# Patient Record
Sex: Male | Born: 2000 | Hispanic: No | Marital: Single | Smoking: Current every day smoker
Health system: Southern US, Community
[De-identification: ages and names within clinical notes are randomized; demographics above are authoritative.]

## PROBLEM LIST (undated history)

## (undated) DIAGNOSIS — F909 Attention-deficit hyperactivity disorder, unspecified type: Secondary | ICD-10-CM

## (undated) DIAGNOSIS — F32A Depression, unspecified: Secondary | ICD-10-CM

## (undated) DIAGNOSIS — F319 Bipolar disorder, unspecified: Secondary | ICD-10-CM

## (undated) DIAGNOSIS — E785 Hyperlipidemia, unspecified: Secondary | ICD-10-CM

## (undated) DIAGNOSIS — I10 Essential (primary) hypertension: Secondary | ICD-10-CM

## (undated) HISTORY — PX: TYMPANOSTOMY TUBE PLACEMENT: SHX32

## (undated) HISTORY — DX: Essential (primary) hypertension: I10

## (undated) HISTORY — DX: Bipolar disorder, unspecified: F31.9

## (undated) HISTORY — DX: Hyperlipidemia, unspecified: E78.5

## (undated) HISTORY — PX: WRIST SURGERY: SHX841

## (undated) HISTORY — DX: Depression, unspecified: F32.A

## (undated) HISTORY — DX: Attention-deficit hyperactivity disorder, unspecified type: F90.9

---
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
## 2018-04-15 ENCOUNTER — Emergency Department
Admission: EM | Admit: 2018-04-15 | Discharge: 2018-04-16 | Disposition: A | Discharge status: Home / Self Care | Payer: Medicaid Other | Attending: Emergency Medicine | Admitting: Emergency Medicine

## 2018-04-15 ENCOUNTER — Other Ambulatory Visit: Payer: Self-pay

## 2018-04-15 DIAGNOSIS — R05 Cough: Secondary | ICD-10-CM | POA: Insufficient documentation

## 2018-04-15 DIAGNOSIS — J029 Acute pharyngitis, unspecified: Secondary | ICD-10-CM

## 2018-04-15 DIAGNOSIS — B349 Viral infection, unspecified: Secondary | ICD-10-CM | POA: Diagnosis not present

## 2018-04-15 DIAGNOSIS — Z87891 Personal history of nicotine dependence: Secondary | ICD-10-CM | POA: Insufficient documentation

## 2018-04-15 DIAGNOSIS — R509 Fever, unspecified: Secondary | ICD-10-CM | POA: Diagnosis present

## 2018-04-15 DIAGNOSIS — R059 Cough, unspecified: Secondary | ICD-10-CM

## 2018-04-15 NOTE — ED Notes (Signed)
Pt called in triage X3 no response. Will try again.

## 2018-04-15 NOTE — ED Triage Notes (Addendum)
Patient reports running a fever today.  Patient also reports sore throat and cough.

## 2018-04-16 ENCOUNTER — Emergency Department: Payer: Medicaid Other

## 2018-04-16 LAB — GROUP A STREP BY PCR: Group A Strep by PCR: NOT DETECTED

## 2018-04-16 MED ORDER — MAGIC MOUTHWASH: 5.0000 mL | Freq: Three times a day (TID) | ORAL | 0 refills | Status: DC | PRN

## 2018-04-16 MED ORDER — MAGIC MOUTHWASH
10.0000 mL | Freq: Once | ORAL | Status: AC
  Administered 2018-04-16: 10 mL via ORAL
  Filled 2018-04-16: qty 10

## 2018-04-16 NOTE — ED Provider Notes (Signed)
[facility] Emergency Department Provider Note   ____________________________________________   First MD Initiated Contact with Patient 04/16/18 0050     (approximate)  I have reviewed the triage vital signs and the nursing notes.   HISTORY  Chief Complaint Fever    HPI Ivan [name] is a 17 y.o. male who presents to the ED from home with a chief complaint of fever, sore throat and cough.  Patient reports a 1 day history of the above symptoms.  States maximum temperature was "100 something".  Denies ear pain, chest pain, shortness of breath, abdominal pain, nausea, vomiting or diarrhea.  Denies recent travel or trauma.   Past medical history None  There are no active problems to display for this patient.   No past surgical history on file.  Prior to Admission medications   Medication Sig Start Date End Date Taking? Authorizing Provider  magic mouthwash SOLN Take 5 mLs by mouth 3 (three) times daily as needed for mouth pain. 04/16/18   Irean HongSung, Armella Stogner J, MD    Allergies Patient has no known allergies.  No family history on file.  Social History Social History   Tobacco Use  . Smoking status: Current Every Day Smoker  Substance Use Topics  . Alcohol use: No    Frequency: Never  . Drug use: Not on file    Review of Systems  Constitutional: Positive for fever. Eyes: No visual changes. ENT: Positive for sore throat. Cardiovascular: Denies chest pain. Respiratory: Positive for nonproductive cough.  Denies shortness of breath. Gastrointestinal: No abdominal pain.  No nausea, no vomiting.  No diarrhea.  No constipation. Genitourinary: Negative for dysuria. Musculoskeletal: Negative for back pain. Skin: Negative for rash. Neurological: Negative for headaches, focal weakness or numbness.   ____________________________________________   PHYSICAL EXAM:  VITAL SIGNS: ED Triage Vitals  Enc Vitals Group     BP 04/15/18 2227 (!) 143/85   Pulse Rate 04/15/18 2227 97     Resp 04/15/18 2227 18     Temp 04/15/18 2227 98.4 F (36.9 C)     Temp Source 04/15/18 2227 Oral     SpO2 04/15/18 2227 95 %     Weight 04/15/18 2224 220 lb (99.8 kg)     Height 04/15/18 2224 6\' 3"  (1.905 m)     Head Circumference --      Peak Flow --      Pain Score --      Pain Loc --      Pain Edu? --      Excl. in GC? --     Constitutional: Asleep, awakened for exam.  Alert and oriented. Well appearing and in no acute distress. Eyes: Conjunctivae are normal. PERRL. EOMI. Head: Atraumatic. Nose: No congestion/rhinnorhea. Mouth/Throat: Mucous membranes are moist.  Oropharynx minimally erythematous without tonsillar swelling, exudates or peritonsillar abscess.  There is no hoarse or muffled voice.  There is no drooling. Neck: No stridor.  Supple neck without meningismus. Hematological/Lymphatic/Immunilogical: No cervical lymphadenopathy. Cardiovascular: Normal rate, regular rhythm. Grossly normal heart sounds.  Good peripheral circulation. Respiratory: Normal respiratory effort.  No retractions. Lungs CTAB. Gastrointestinal: Soft and nontender. No distention. No abdominal bruits. No CVA tenderness. Musculoskeletal: No lower extremity tenderness nor edema.  No joint effusions. Neurologic:  Normal speech and language. No gross focal neurologic deficits are appreciated. No gait instability. Skin:  Skin is warm, dry and intact. No rash noted.  No petechiae. Psychiatric: Mood and affect are normal. Speech and behavior are normal.  ____________________________________________   LABS (all labs ordered are listed, but only abnormal results are displayed)  Labs Reviewed  GROUP A STREP BY PCR   ____________________________________________  EKG  None ____________________________________________  RADIOLOGY  ED MD interpretation: No acute cardiopulmonary process  Official radiology report(s): Dg Chest 2 View  Result Date: 04/16/2018 CLINICAL  DATA:  Fever and cough EXAM: CHEST - 2 VIEW COMPARISON:  None. FINDINGS: The heart size and mediastinal contours are within normal limits. Both lungs are clear. The visualized skeletal structures are unremarkable. IMPRESSION: No active cardiopulmonary disease. Electronically Signed   By: Jasmine Pang M.D.   On: 04/16/2018 00:19    ____________________________________________   PROCEDURES  Procedure(s) performed: None  Procedures  Critical Care performed: No  ____________________________________________   INITIAL IMPRESSION / ASSESSMENT AND PLAN / ED COURSE  As part of my medical decision making, I reviewed the following data within the electronic MEDICAL RECORD NUMBER Nursing notes reviewed and incorporated, Labs reviewed, Radiograph reviewed and Notes from prior ED visits   17 year old male who presents with URI symptoms.  Rapid strep is negative; chest x-ray negative for pneumonia.  Will treat with Magic mouthwash and patient will follow-up with his PCP as needed.  Strict return precautions given.  Patient verbalizes understanding and agrees with plan of care.      ____________________________________________   FINAL CLINICAL IMPRESSION(S) / ED DIAGNOSES  Final diagnoses:  Viral illness  Sore throat  Cough     ED Discharge Orders        Ordered    magic mouthwash SOLN  3 times daily PRN     04/16/18 0116       Note:  This document was prepared using Dragon voice recognition software and may include unintentional dictation errors.    Irean Hong, MD 04/16/18 [phone]

## 2018-04-16 NOTE — Discharge Instructions (Addendum)
1.  You may take Magic mouthwash as needed for throat discomfort. 2.  Return to the ER for worsening symptoms, persistent vomiting, difficulty breathing or other concerns.

## 2018-09-28 IMAGING — CR DG CHEST 2V
2 series · 2 of 2 positions shown · non-contrast
Comparison: None.

CLINICAL DATA: Fever and cough

EXAM:
CHEST - 2 VIEW

[chest pa]
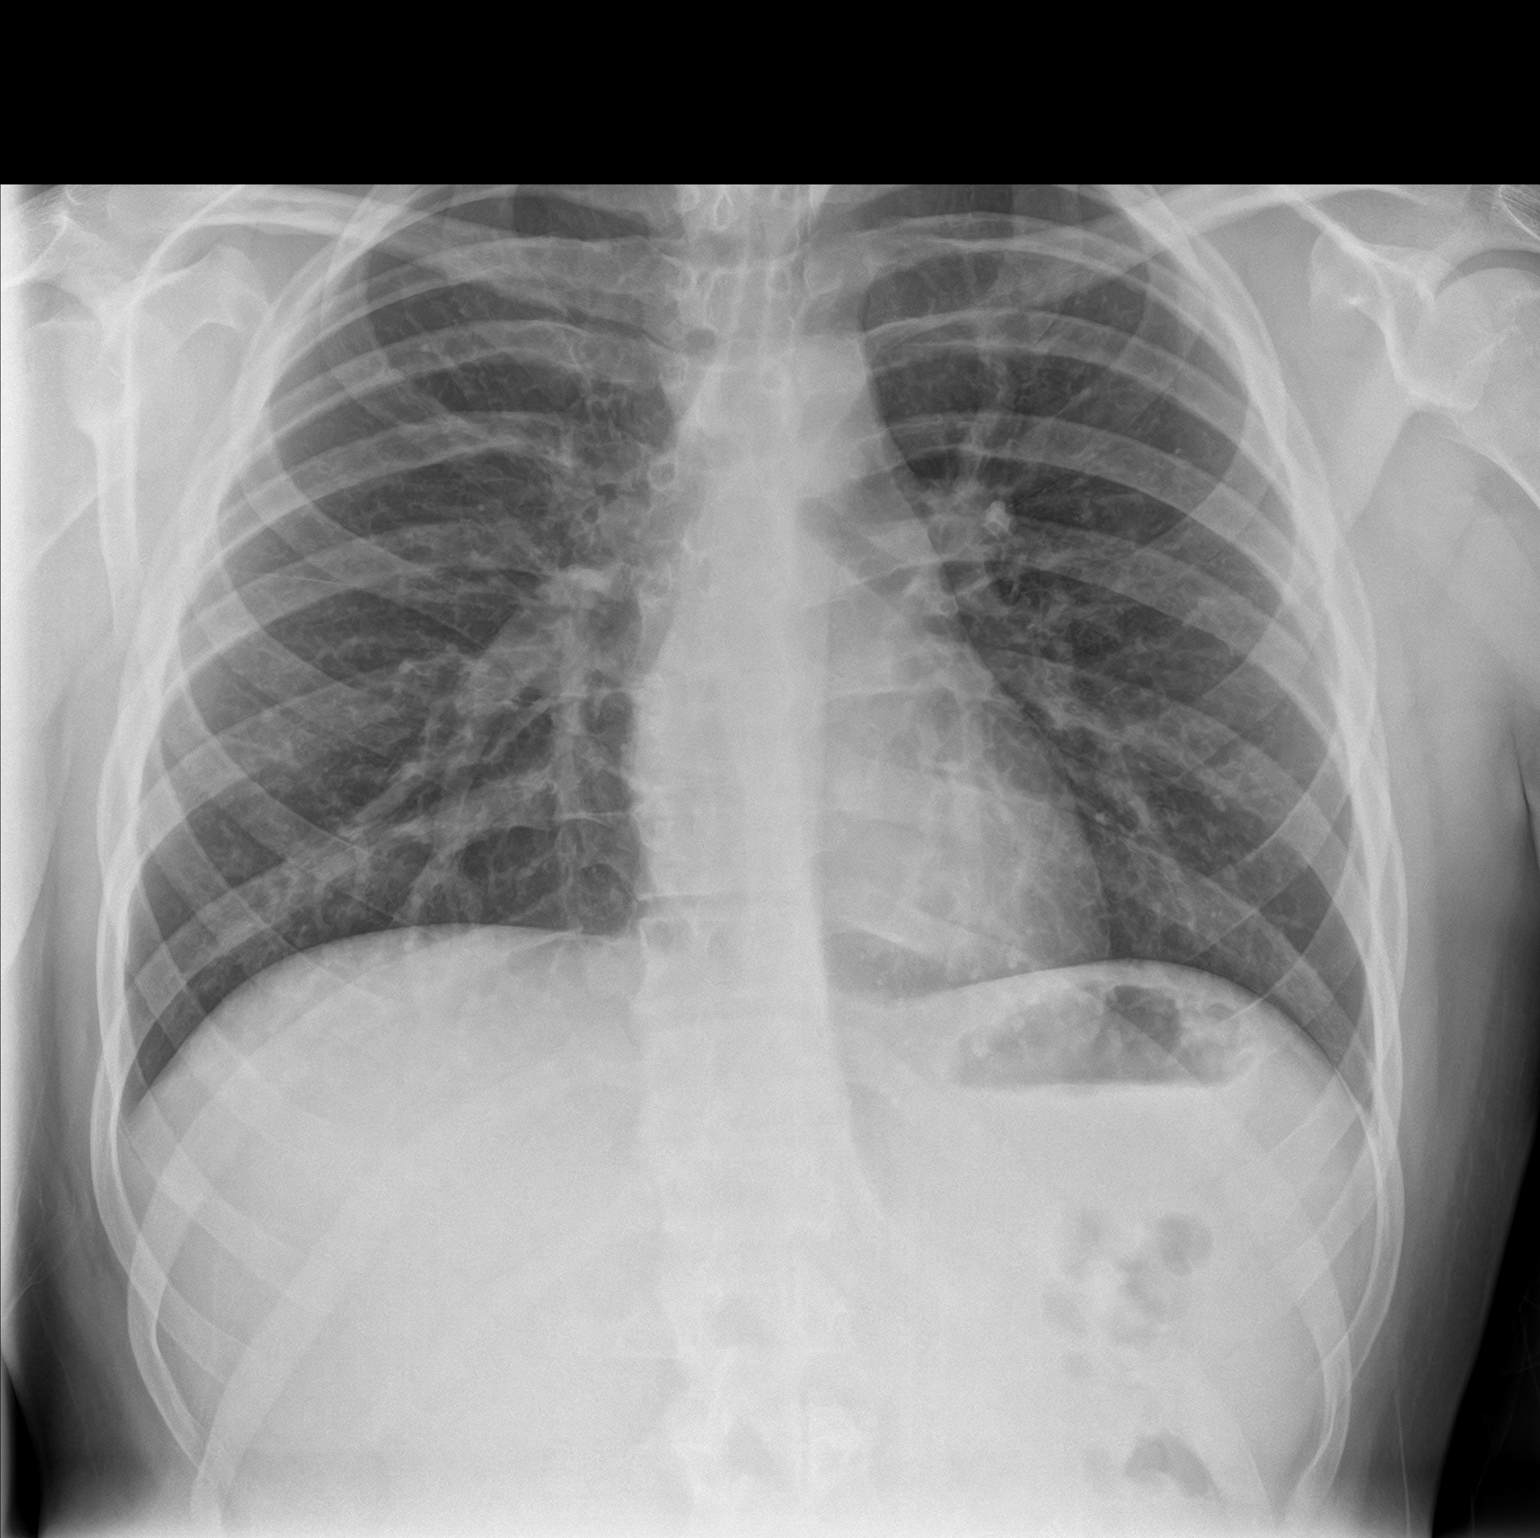

[chest lat]
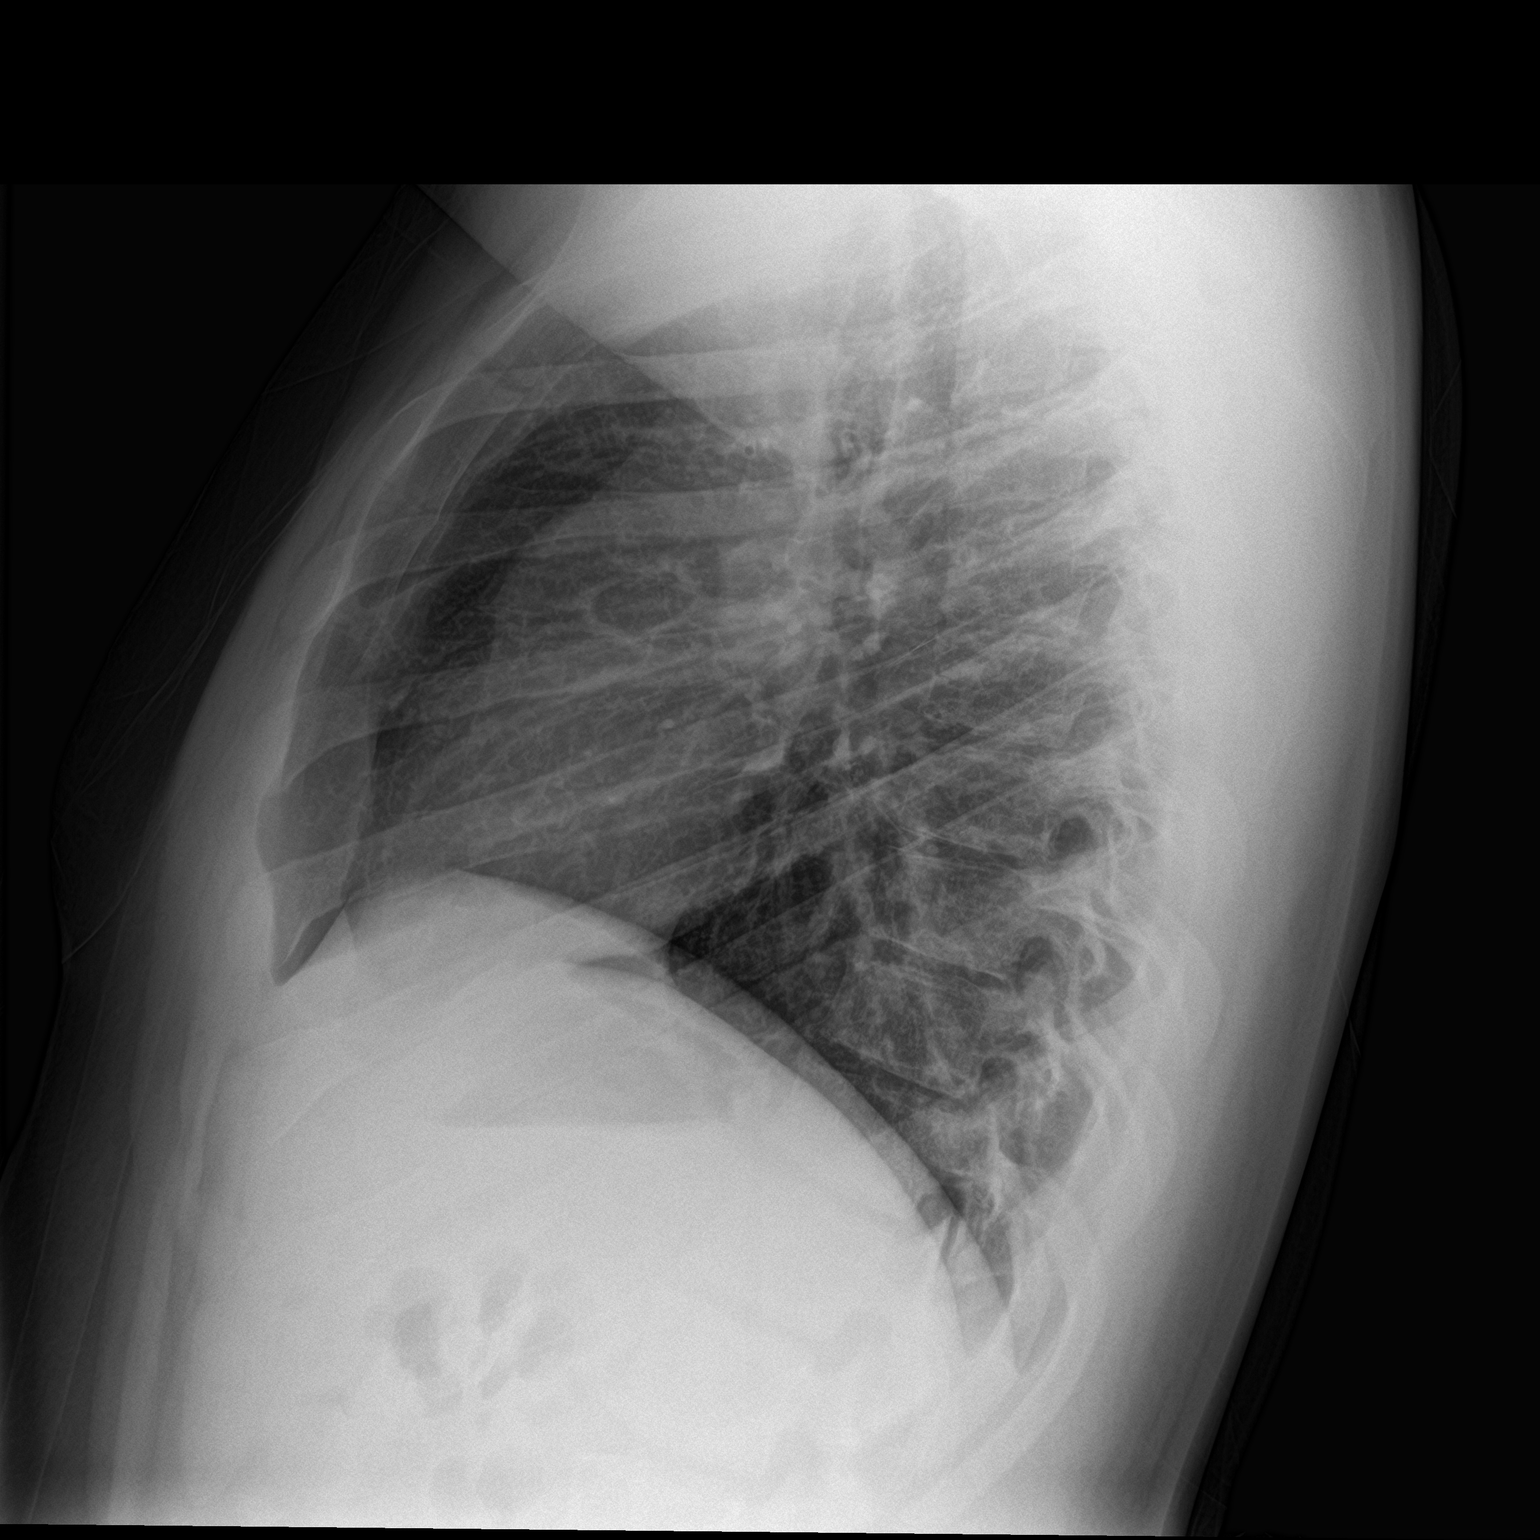

[2 of 2 positions shown; findings below may reference images not displayed]

FINDINGS: The heart size and mediastinal contours are within normal limits.
Both lungs are clear. The visualized skeletal structures are
unremarkable.
IMPRESSION: No active cardiopulmonary disease.

## 2021-06-17 ENCOUNTER — Encounter: Payer: Self-pay | Admitting: Emergency Medicine

## 2021-06-17 ENCOUNTER — Emergency Department
Admission: EM | Admit: 2021-06-17 | Discharge: 2021-06-17 | Disposition: A | Discharge status: Home / Self Care | Payer: Medicare Other | Attending: Emergency Medicine | Admitting: Emergency Medicine

## 2021-06-17 ENCOUNTER — Other Ambulatory Visit: Payer: Self-pay

## 2021-06-17 DIAGNOSIS — R040 Epistaxis: Secondary | ICD-10-CM | POA: Insufficient documentation

## 2021-06-17 DIAGNOSIS — F17210 Nicotine dependence, cigarettes, uncomplicated: Secondary | ICD-10-CM | POA: Diagnosis not present

## 2021-06-17 NOTE — ED Triage Notes (Signed)
Pt reports had a nose bleed today. Pt caregiver reports it bled for a few minutes and they pinched it and it stopped but they wanted him checked out.

## 2021-06-17 NOTE — ED Notes (Signed)
See triage note  Presents with nose bleed   Care giver states that he had a nosebleed that lasted only a few mins  No bleeding at present

## 2021-06-17 NOTE — ED Provider Notes (Signed)
[facility]-EMERGENCY DEPARTMENT  ____________________________________________  Time seen: Approximately 7:18 PM  I have reviewed the triage vital signs and the nursing notes.   HISTORY  Chief Complaint Epistaxis   Historian Patient     HPI Ivan [name] is a 20 y.o. male presents to the emergency department with a brief, resolved episode of epistaxis that occurred earlier in the day.  Patient resides in a group home and is accompanied by a caregiver.  Caregiver reports that nasal clamping occurred at group home and patient has been asymptomatic for the past several hours.  No history of bleeding disorders or recent trauma.  Patient has not started any new medications or nasal sprays.   History reviewed. No pertinent past medical history.   Immunizations up to date:  Yes.     History reviewed. No pertinent past medical history.  There are no problems to display for this patient.   History reviewed. No pertinent surgical history.  Prior to Admission medications   Medication Sig Start Date End Date Taking? Authorizing Provider  magic mouthwash SOLN Take 5 mLs by mouth 3 (three) times daily as needed for mouth pain. 04/16/18   Irean Hong, MD    Allergies Patient has no known allergies.  No family history on file.  Social History Social History   Tobacco Use   Smoking status: Every Day    Pack years: 0.00  Substance Use Topics   Alcohol use: No     Review of Systems  Constitutional: No fever/chills Eyes:  No discharge ENT: Patient has resolved epistaxis.  Respiratory: no cough. No SOB/ use of accessory muscles to breath Gastrointestinal:   No nausea, no vomiting.  No diarrhea.  No constipation. Musculoskeletal: Negative for musculoskeletal pain.* Skin: Negative for rash, abrasions, lacerations, ecchymosis.   ____________________________________________   PHYSICAL EXAM:  VITAL SIGNS: ED Triage Vitals  Enc Vitals Group     BP 06/17/21 1817 123/80      Pulse Rate 06/17/21 1817 (!) 104     Resp 06/17/21 1817 17     Temp 06/17/21 1817 98.2 F (36.8 C)     Temp Source 06/17/21 1817 Oral     SpO2 06/17/21 1817 98 %     Weight 06/17/21 1816 235 lb (106.6 kg)     Height 06/17/21 1816 6\' 3"  (1.905 m)     Head Circumference --      Peak Flow --      Pain Score 06/17/21 1816 0     Pain Loc --      Pain Edu? --      Excl. in GC? --      Constitutional: Alert and oriented. Well appearing and in no acute distress. Eyes: Conjunctivae are normal. PERRL. EOMI. Head: Atraumatic. ENT:      Ears:       Nose: No congestion/rhinnorhea.  Patient has dried blood at right nare.      Mouth/Throat: Mucous membranes are moist.  Neck: No stridor.  No cervical spine tenderness to palpation. Cardiovascular: Normal rate, regular rhythm. Normal S1 and S2.  Good peripheral circulation. Respiratory: Normal respiratory effort without tachypnea or retractions. Lungs CTAB. Good air entry to the bases with no decreased or absent breath sounds Gastrointestinal: Bowel sounds x 4 quadrants. Soft and nontender to palpation. No guarding or rigidity. No distention. Musculoskeletal: Full range of motion to all extremities. No obvious deformities noted Neurologic:  Normal for age. No gross focal neurologic deficits are appreciated.  Skin:  Skin is  warm, dry and intact. No rash noted. Psychiatric: Mood and affect are normal for age. Speech and behavior are normal.   ____________________________________________   LABS (all labs ordered are listed, but only abnormal results are displayed)  Labs Reviewed - No data to display ____________________________________________  EKG   ____________________________________________  RADIOLOGY   No results found.  ____________________________________________    PROCEDURES  Procedure(s) performed:     Procedures     Medications - No data to display   ____________________________________________   INITIAL  IMPRESSION / ASSESSMENT AND PLAN / ED COURSE  Pertinent labs & imaging results that were available during my care of the patient were reviewed by me and considered in my medical decision making (see chart for details).       Assessment and plan Epistaxis 20 year old male presents to the emergency department after a brief, resolved episode of epistaxis.  On exam, patient had evidence of dried blood at the right nare with no active bleeding.  Reassurance was given.  All patient questions were answered.    ____________________________________________  FINAL CLINICAL IMPRESSION(S) / ED DIAGNOSES  Final diagnoses:  Epistaxis      NEW MEDICATIONS STARTED DURING THIS VISIT:  ED Discharge Orders     None           This chart was dictated using voice recognition software/Dragon. Despite best efforts to proofread, errors can occur which can change the meaning. Any change was purely unintentional.     Orvil Feil, PA-C 06/17/21 Maryruth Eve, MD 06/17/21 [phone]

## 2024-04-01 DIAGNOSIS — E785 Hyperlipidemia, unspecified: Secondary | ICD-10-CM | POA: Diagnosis not present

## 2024-04-01 DIAGNOSIS — I10 Essential (primary) hypertension: Secondary | ICD-10-CM | POA: Diagnosis not present

## 2024-04-04 DIAGNOSIS — Z79899 Other long term (current) drug therapy: Secondary | ICD-10-CM | POA: Diagnosis not present

## 2024-04-24 ENCOUNTER — Ambulatory Visit: Payer: Medicare Other | Admitting: Nurse Practitioner

## 2024-04-24 ENCOUNTER — Encounter: Payer: Self-pay | Admitting: Nurse Practitioner

## 2024-04-24 VITALS — BP 146/78 | HR 109 | Temp 98.0°F | Resp 18 | Ht 75.0 in | Wt 341.3 lb

## 2024-04-24 DIAGNOSIS — F39 Unspecified mood [affective] disorder: Secondary | ICD-10-CM | POA: Diagnosis not present

## 2024-04-24 DIAGNOSIS — Z131 Encounter for screening for diabetes mellitus: Secondary | ICD-10-CM | POA: Diagnosis not present

## 2024-04-24 DIAGNOSIS — Z6841 Body Mass Index (BMI) 40.0 and over, adult: Secondary | ICD-10-CM

## 2024-04-24 DIAGNOSIS — Z1159 Encounter for screening for other viral diseases: Secondary | ICD-10-CM

## 2024-04-24 DIAGNOSIS — I10 Essential (primary) hypertension: Secondary | ICD-10-CM

## 2024-04-24 DIAGNOSIS — Z13220 Encounter for screening for lipoid disorders: Secondary | ICD-10-CM

## 2024-04-24 DIAGNOSIS — F988 Other specified behavioral and emotional disorders with onset usually occurring in childhood and adolescence: Secondary | ICD-10-CM

## 2024-04-24 DIAGNOSIS — Z8249 Family history of ischemic heart disease and other diseases of the circulatory system: Secondary | ICD-10-CM | POA: Diagnosis not present

## 2024-04-24 DIAGNOSIS — Z114 Encounter for screening for human immunodeficiency virus [HIV]: Secondary | ICD-10-CM

## 2024-04-24 DIAGNOSIS — B360 Pityriasis versicolor: Secondary | ICD-10-CM

## 2024-04-24 DIAGNOSIS — E782 Mixed hyperlipidemia: Secondary | ICD-10-CM

## 2024-04-24 DIAGNOSIS — Z130 Encounter for screening for diseases of the blood and blood-forming organs and certain disorders involving the immune mechanism: Secondary | ICD-10-CM

## 2024-04-24 DIAGNOSIS — Z7689 Persons encountering health services in other specified circumstances: Secondary | ICD-10-CM

## 2024-04-24 DIAGNOSIS — E66813 Obesity, class 3: Secondary | ICD-10-CM

## 2024-04-24 MED ORDER — KETOCONAZOLE 2 % EX SHAM: MEDICATED_SHAMPOO | CUTANEOUS | 1 refills | Status: DC

## 2024-04-24 NOTE — Progress Notes (Signed)
 BP (!) 146/78   Pulse (!) 109   Temp 98 F (36.7 C)   Resp 18   Ht 6\' 3"  (1.905 m)   Wt (!) 341 lb 4.8 oz (154.8 kg)   SpO2 98%   BMI 42.66 kg/m    Subjective:    Patient ID: Ivan [name], male    DOB: [birth year], 23 y.o.   MRN: [id number]  HPI: Ivan [name] is a 23 y.o. male  Chief Complaint  Patient presents with   Establish Care    Transfer from old PCP in [place] [facility]    Discussed the use of AI scribe software for clinical note transcription with the patient, who gave verbal consent to proceed.  History of Present Illness Ivan [name] is a 23 year old male who presents to establish care.  He has a history of obesity, with a current weight of 341 pounds and a BMI of 42.66. He is currently taking lisinopril 10 mg daily and clonidine 0.1 mg three times a day for blood pressure management. His recent blood pressure reading was 200/100, which was rechecked and found to be 146/78.  He has hyperlipidemia and is taking atorvastatin 10 mg daily. Despite a family history significant for heart disease, including heart attacks in his father and grandmother, and multiple strokes and heart attacks in his aunts, he has not seen a cardiologist as an adult.  He has a psychiatric history, including episodic mood disorder and ADD, and is under the care of [facility]. His current psychiatric medications include Depakote 1000 mg at bedtime, Cymbalta 20 mg twice a day, and Zyprexa 10 mg daily.  He has a skin condition diagnosed as tinea versicolor, which has been present for two to three years, primarily affecting one arm. He has not tried any treatment.         04/24/2024    1:29 PM  Depression screen PHQ 2/9  Decreased Interest 0  Down, Depressed, Hopeless 0  PHQ - 2 Score 0  Altered sleeping 0  Tired, decreased energy 0  Change in appetite 0  Feeling bad or failure about yourself  0  Trouble concentrating 0  Moving slowly or  fidgety/restless 0  Suicidal thoughts 0  PHQ-9 Score 0  Difficult doing work/chores Not difficult at all    Relevant past medical, surgical, family and social history reviewed and updated as indicated. Interim medical history since our last visit reviewed. Allergies and medications reviewed and updated.  Review of Systems  Constitutional: Negative for fever or weight change.  Respiratory: Negative for cough and shortness of breath.   Cardiovascular: Negative for chest pain or palpitations.  Gastrointestinal: Negative for abdominal pain, no bowel changes.  Musculoskeletal: Negative for gait problem or joint swelling.  Skin: Negative for rash.  Neurological: Negative for dizziness or headache.  No other specific complaints in a complete review of systems (except as listed in HPI above).      Objective:    BP (!) 146/78   Pulse (!) 109   Temp 98 F (36.7 C)   Resp 18   Ht 6\' 3"  (1.905 m)   Wt (!) 341 lb 4.8 oz (154.8 kg)   SpO2 98%   BMI 42.66 kg/m    Wt Readings from Last 3 Encounters:  04/24/24 (!) 341 lb 4.8 oz (154.8 kg)  06/17/21 235 lb (106.6 kg)  04/15/18 220 lb (99.8 kg) (97%, Z= 1.91)*   * Growth percentiles are based on CDC (  Boys, 2-20 Years) data.    Physical Exam Physical Exam VITALS: BP- 146/78 MEASUREMENTS: Weight- 341, BMI- 42.66. GENERAL: Alert, cooperative, well developed, no acute distress. HEENT: Normocephalic, normal oropharynx, moist mucous membranes. CHEST: Clear to auscultation bilaterally. No wheezes, rhonchi, or crackles. CARDIOVASCULAR: Normal heart rate and rhythm. S1 and S2 normal without murmurs. ABDOMEN: Soft, non-tender, non-distended, without organomegaly. Normal bowel sounds. EXTREMITIES: No cyanosis or edema. NEUROLOGICAL: Cranial nerves grossly intact. Moves all extremities without gross motor or sensory deficit. SKIN: Tinea versicolor on the left and right arm, and on chest        Assessment & Plan:   Problem List Items  Addressed This Visit       Cardiovascular and Mediastinum   Primary hypertension - Primary   Relevant Medications   atorvastatin (LIPITOR) 10 MG tablet   lisinopril (ZESTRIL) 10 MG tablet   cloNIDine (CATAPRES) 0.1 MG tablet   Other Relevant Orders   CBC with Differential/Platelet   Comprehensive metabolic panel with GFR     Other   Episodic mood disorder (HCC)   Attention deficit disorder of childhood   Mixed hyperlipidemia   Relevant Medications   atorvastatin (LIPITOR) 10 MG tablet   lisinopril (ZESTRIL) 10 MG tablet   cloNIDine (CATAPRES) 0.1 MG tablet   Other Relevant Orders   Lipid panel   Lipoprotein A (LPA)   Other Visit Diagnoses       Class 3 severe obesity due to excess calories with serious comorbidity and body mass index (BMI) of 40.0 to 44.9 in adult [facility])       Relevant Orders   TSH     Encounter to establish care         Screening for deficiency anemia       Relevant Orders   CBC with Differential/Platelet     Screening for cholesterol level         Screening for diabetes mellitus       Relevant Orders   Comprehensive metabolic panel with GFR   Hemoglobin A1c     Screening for HIV without presence of risk factors       Relevant Orders   HIV Antibody (routine testing w rflx)     Encounter for hepatitis C screening test for low risk patient       Relevant Orders   Hepatitis C antibody     Family history of cardiac arrest       Relevant Orders   Ambulatory referral to Cardiology     Tinea versicolor       Relevant Medications   ketoconazole (NIZORAL) 2 % shampoo        Assessment and Plan Assessment & Plan Hypertension Hypertension with current elevated blood pressure reading of 200/100 mmHg, rechecked at 146/78 mmHg. Managed with clonidine and lisinopril. Extensive family history of cardiovascular disease, including myocardial infarctions and cerebrovascular accidents, necessitating further evaluation. - Refer to cardiology for further  evaluation of cardiovascular risk. - Order lipoprotein A test to assess specific cardiovascular risk.  Hyperlipidemia Hyperlipidemia managed with atorvastatin 10 mg daily.  Obesity Morbid obesity with a BMI of 42.66. -comorbidities include htn, hld  Tinea versicolor Tinea versicolor present for 2-3 years on the arm and extending higher. Non-harmful fungal infection. - Prescribe ketoconazole 2% shampoo to be applied daily for 7-14 days, leaving it on for 5-10 minutes before rinsing. - Provide refill for ketoconazole shampoo if needed.   Mood disorder/add -continue to follow up with psychiatry  Follow up plan: Return in 6 months (on 10/24/2024) for AWV w/ nurse, follow up.

## 2024-04-26 LAB — CBC WITH DIFFERENTIAL/PLATELET
Absolute Lymphocytes: 2873 {cells}/uL (ref 850–3900)
Absolute Monocytes: 728 {cells}/uL (ref 200–950)
Basophils Absolute: 60 {cells}/uL (ref 0–200)
Basophils Relative: 0.8 %
Eosinophils Absolute: 120 {cells}/uL (ref 15–500)
Eosinophils Relative: 1.6 %
HCT: 41.4 % (ref 38.5–50.0)
Hemoglobin: 13.9 g/dL (ref 13.2–17.1)
MCH: 29.6 pg (ref 27.0–33.0)
MCHC: 33.6 g/dL (ref 32.0–36.0)
MCV: 88.3 fL (ref 80.0–100.0)
MPV: 11.6 fL (ref 7.5–12.5)
Monocytes Relative: 9.7 %
Neutro Abs: 3720 {cells}/uL (ref 1500–7800)
Neutrophils Relative %: 49.6 %
Platelets: 313 10*3/uL (ref 140–400)
RBC: 4.69 10*6/uL (ref 4.20–5.80)
RDW: 12.7 % (ref 11.0–15.0)
Total Lymphocyte: 38.3 %
WBC: 7.5 10*3/uL (ref 3.8–10.8)

## 2024-04-26 LAB — COMPREHENSIVE METABOLIC PANEL WITH GFR
AG Ratio: 1.5 (calc) (ref 1.0–2.5)
ALT: 20 U/L (ref 9–46)
AST: 16 U/L (ref 10–40)
Albumin: 4.5 g/dL (ref 3.6–5.1)
Alkaline phosphatase (APISO): 69 U/L (ref 36–130)
BUN: 11 mg/dL (ref 7–25)
CO2: 29 mmol/L (ref 20–32)
Calcium: 10.1 mg/dL (ref 8.6–10.3)
Chloride: 101 mmol/L (ref 98–110)
Creat: 0.92 mg/dL (ref 0.60–1.24)
Globulin: 3 g/dL (ref 1.9–3.7)
Glucose, Bld: 99 mg/dL (ref 65–99)
Potassium: 4.4 mmol/L (ref 3.5–5.3)
Sodium: 139 mmol/L (ref 135–146)
Total Bilirubin: 0.3 mg/dL (ref 0.2–1.2)
Total Protein: 7.5 g/dL (ref 6.1–8.1)
eGFR: 118 mL/min/{1.73_m2} (ref >=60)

## 2024-04-26 LAB — LIPID PANEL
Cholesterol: 165 mg/dL (ref <200)
HDL: 29 mg/dL — ABNORMAL LOW (ref >=40)
LDL Cholesterol (Calc): 98 mg/dL
Non-HDL Cholesterol (Calc): 136 mg/dL — ABNORMAL HIGH (ref <130)
Total CHOL/HDL Ratio: 5.7 (calc) — ABNORMAL HIGH (ref <5.0)
Triglycerides: 269 mg/dL — ABNORMAL HIGH (ref <150)

## 2024-04-26 LAB — LIPOPROTEIN A (LPA): Lipoprotein (a): 10 nmol/L (ref <75)

## 2024-04-26 LAB — HEMOGLOBIN A1C
Hgb A1c MFr Bld: 5.5 % (ref <5.7)
Mean Plasma Glucose: 111 mg/dL
eAG (mmol/L): 6.2 mmol/L

## 2024-04-26 LAB — TSH: TSH: 1.51 m[IU]/L (ref 0.40–4.50)

## 2024-04-26 LAB — HIV ANTIBODY (ROUTINE TESTING W REFLEX): HIV 1&2 Ab, 4th Generation: NONREACTIVE

## 2024-04-26 LAB — HEPATITIS C ANTIBODY: Hepatitis C Ab: NONREACTIVE

## 2024-05-01 ENCOUNTER — Other Ambulatory Visit: Payer: Self-pay | Admitting: Nurse Practitioner

## 2024-05-01 DIAGNOSIS — B360 Pityriasis versicolor: Secondary | ICD-10-CM

## 2024-05-01 DIAGNOSIS — Z79899 Other long term (current) drug therapy: Secondary | ICD-10-CM | POA: Diagnosis not present

## 2024-05-01 NOTE — Telephone Encounter (Signed)
 Copied from CRM [id number]. Topic: Clinical - Medication Refill >> May 01, 2024 11:13 AM Talmad[place] Fail S wrote: Medication: atorvastatin (LIPITOR) 10 MG tablet  cloNIDine (CATAPRES) 0.1 MG tablet  divalproex (DEPAKOTE ER) 500 MG 24 hr tablet  DULoxetine (CYMBALTA) 20 MG capsule  ergocalciferol (VITAMIN D2) 1.25 MG (50000 UT) capsule  ketoconazole  (NIZORAL ) 2 % shampoo   lisinopril (ZESTRIL) 10 MG tablet   OLANZapine (ZYPREXA) 10 MG tablet  Has the patient contacted their pharmacy? No (Agent: If no, request that the patient contact the pharmacy for the refill. If patient does not wish to contact the pharmacy document the reason why and proceed with request.) (Agent: If yes, when and what did the pharmacy advise?) Advised by provider to let them know if refills are needed  This is the patient's preferred pharmacy:  [facility] - [place], [place] - [address] [place] [address] Phone: [phone] Fax: [phone]  Is this the correct pharmacy for this prescription? Yes If no, delete pharmacy and type the correct one.   Has the prescription been filled recently? No  Is the patient out of the medication? Yes  Has the patient been seen for an appointment in the last year OR does the patient have an upcoming appointment? Yes  Can we respond through MyChart? No  Agent: Please be advised that Rx refills may take up to 3 business days. We ask that you follow-up with your pharmacy.

## 2024-05-03 MED ORDER — CLONIDINE HCL 0.1 MG PO TABS: 0.1000 mg | ORAL_TABLET | Freq: Three times a day (TID) | ORAL | 0 refills | Status: DC

## 2024-05-03 MED ORDER — LISINOPRIL 10 MG PO TABS: 10.0000 mg | ORAL_TABLET | Freq: Every day | ORAL | 1 refills | Status: DC

## 2024-05-03 MED ORDER — KETOCONAZOLE 2 % EX SHAM: MEDICATED_SHAMPOO | CUTANEOUS | 1 refills | Status: AC

## 2024-05-03 MED ORDER — ATORVASTATIN CALCIUM 10 MG PO TABS: 10.0000 mg | ORAL_TABLET | Freq: Every day | ORAL | 1 refills | Status: DC

## 2024-05-03 MED ORDER — ERGOCALCIFEROL 1.25 MG (50000 UT) PO CAPS: 50000.0000 [IU] | ORAL_CAPSULE | ORAL | 0 refills | Status: DC

## 2024-05-03 NOTE — Telephone Encounter (Signed)
 Requested medication (s) are due for refill today - unknown  Requested medication (s) are on the active medication list -yes  Future visit scheduled -no  Last refill: unsure  Notes to clinic: all requested medications are listed as historical medications-requires provider review . NP appt was 04/24/24, Only Rx filled/requested in list- ketoconazole -04/24/24 1RF-non delegated Rx(too soon)  Requested Prescriptions  Pending Prescriptions Disp Refills   atorvastatin (LIPITOR) 10 MG tablet      Sig: Take 1 tablet (10 mg total) by mouth daily.     Cardiovascular:  Antilipid - Statins Failed - 05/03/2024 11:18 AM      Failed - Lipid Panel in normal range within the last 12 months    Cholesterol  Date Value Ref Range Status  04/24/2024 165 <200 mg/dL Final   LDL Cholesterol (Calc)  Date Value Ref Range Status  04/24/2024 98 mg/dL (calc) Final    Comment:    Reference range: <100 . Desirable range <100 mg/dL for primary prevention;   <70 mg/dL for patients with CHD or diabetic patients  with > or = 2 CHD risk factors. Aaron Aas LDL-C is now calculated using the Martin-Hopkins  calculation, which is a validated novel method providing  better accuracy than the Friedewald equation in the  estimation of LDL-C.  Melinda Sprawls et al. Erroll Heard. 9562;130(86): 2061-2068  (http://education.[facility].com/faq/FAQ164)    HDL  Date Value Ref Range Status  04/24/2024 29 (L) > OR = 40 mg/dL Final   Triglycerides  Date Value Ref Range Status  04/24/2024 269 (H) <150 mg/dL Final    Comment:    . If a non-fasting specimen was collected, consider repeat triglyceride testing on a fasting specimen if clinically indicated.  Imagene Mam et al. J. of Clin. Lipidol. 2015;9:129-169. Aaron Aas          Passed - Patient is not pregnant      Passed - Valid encounter within last 12 months    Recent Outpatient Visits           1 week ago Primary hypertension   [place] [facility] Donny Gall F, FNP       Future Appointments             In 2 months Gollan, Timothy J, MD [place] [facility]             cloNIDine (CATAPRES) 0.1 MG tablet 60 tablet     Sig: Take 1 tablet (0.1 mg total) by mouth 3 (three) times daily.     Cardiovascular:  Alpha-2 Agonists Failed - 05/03/2024 11:18 AM      Failed - Last BP in normal range    BP Readings from Last 1 Encounters:  04/24/24 (!) 146/78         Passed - Last Heart Rate in normal range    Pulse Readings from Last 1 Encounters:  04/24/24 (!) 109         Passed - Valid encounter within last 6 months    Recent Outpatient Visits           1 week ago Primary hypertension   [place] [facility] Quinton Buckler, FNP       Future Appointments             In 2 months Gollan, Timothy J, MD [place] [facility]             divalproex (DEPAKOTE ER) 500 MG 24 hr tablet  Sig: Take 2 tablets (1,000 mg total) by mouth at bedtime.     Neurology:  Anticonvulsants - Valproates Failed - 05/03/2024 11:18 AM      Failed - Valproic Acid  (serum) in normal range and within 360 days    Valproic Acid  Lvl  Date Value Ref Range Status  09/27/2007 93.9  Final         Passed - AST in normal range and within 360 days    AST  Date Value Ref Range Status  04/24/2024 16 10 - 40 U/L Final         Passed - ALT in normal range and within 360 days    ALT  Date Value Ref Range Status  04/24/2024 20 9 - 46 U/L Final         Passed - HGB in normal range and within 360 days    Hemoglobin  Date Value Ref Range Status  04/24/2024 13.9 13.2 - 17.1 g/dL Final         Passed - PLT in normal range and within 360 days    Platelets  Date Value Ref Range Status  04/24/2024 313 140 - 400 Thousand/uL Final         Passed - WBC in normal range and within 360 days    WBC  Date Value Ref Range Status  04/24/2024 7.5 3.8 - 10.8 Thousand/uL Final         Passed - HCT in normal range  and within 360 days    HCT  Date Value Ref Range Status  04/24/2024 41.4 38.5 - 50.0 % Final         Passed - Completed PHQ-2 or PHQ-9 in the last 360 days      Passed - Patient is not pregnant      Passed - Valid encounter within last 12 months    Recent Outpatient Visits           1 week ago Primary hypertension   [place] [facility] Donny Gall F, FNP       Future Appointments             In 2 months Gollan, Timothy J, MD [place] [facility]             DULoxetine (CYMBALTA) 20 MG capsule      Sig: Take 1 capsule (20 mg total) by mouth 2 (two) times daily.     Psychiatry: Antidepressants - SNRI - duloxetine Failed - 05/03/2024 11:18 AM      Failed - Last BP in normal range    BP Readings from Last 1 Encounters:  04/24/24 (!) 146/78         Passed - Cr in normal range and within 360 days    Creat  Date Value Ref Range Status  04/24/2024 0.92 0.60 - 1.24 mg/dL Final         Passed - eGFR is 30 or above and within 360 days    GFR calc Af Amer  Date Value Ref Range Status  09/27/2007   Final   NOT CALCULATED        The eGFR has been calculated using the MDRD equation. This calculation has not been validated in all clinical   GFR calc non Af Amer  Date Value Ref Range Status  09/27/2007 NOT CALCULATED  Final   eGFR  Date Value Ref Range Status  04/24/2024 118 > OR = 60 mL/min/1.47m2 Final  Passed - Completed PHQ-2 or PHQ-9 in the last 360 days      Passed - Valid encounter within last 6 months    Recent Outpatient Visits           1 week ago Primary hypertension   [place] [facility] Quinton Buckler, FNP       Future Appointments             In 2 months Gollan, Timothy J, MD [place] [facility]             lisinopril (ZESTRIL) 10 MG tablet      Sig: Take 1 tablet (10 mg total) by mouth daily.     Cardiovascular:  ACE Inhibitors Failed - 05/03/2024 11:18 AM       Failed - Last BP in normal range    BP Readings from Last 1 Encounters:  04/24/24 (!) 146/78         Passed - Cr in normal range and within 180 days    Creat  Date Value Ref Range Status  04/24/2024 0.92 0.60 - 1.24 mg/dL Final         Passed - K in normal range and within 180 days    Potassium  Date Value Ref Range Status  04/24/2024 4.4 3.5 - 5.3 mmol/L Final         Passed - Patient is not pregnant      Passed - Valid encounter within last 6 months    Recent Outpatient Visits           1 week ago Primary hypertension   [place] [facility] Donny Gall F, FNP       Future Appointments             In 2 months Gollan, Timothy J, MD [place] [facility]             OLANZapine (ZYPREXA) 10 MG tablet      Sig: Take 1 tablet (10 mg total) by mouth at bedtime.     Not Delegated - Psychiatry:  Antipsychotics - Second Generation (Atypical) - olanzapine Failed - 05/03/2024 11:18 AM      Failed - This refill cannot be delegated      Failed - Last BP in normal range    BP Readings from Last 1 Encounters:  04/24/24 (!) 146/78         Failed - Lipid Panel in normal range within the last 12 months    Cholesterol  Date Value Ref Range Status  04/24/2024 165 <200 mg/dL Final   LDL Cholesterol (Calc)  Date Value Ref Range Status  04/24/2024 98 mg/dL (calc) Final    Comment:    Reference range: <100 . Desirable range <100 mg/dL for primary prevention;   <70 mg/dL for patients with CHD or diabetic patients  with > or = 2 CHD risk factors. Aaron Aas LDL-C is now calculated using the Martin-Hopkins  calculation, which is a validated novel method providing  better accuracy than the Friedewald equation in the  estimation of LDL-C.  Melinda Sprawls et al. Erroll Heard. 8413;244(01): 2061-2068  (http://education.[facility].com/faq/FAQ164)    HDL  Date Value Ref Range Status  04/24/2024 29 (L) > OR = 40 mg/dL Final   Triglycerides  Date  Value Ref Range Status  04/24/2024 269 (H) <150 mg/dL Final    Comment:    . If a non-fasting specimen was collected, consider repeat triglyceride testing on  a fasting specimen if clinically indicated.  Imagene Mam et al. J. of Clin. Lipidol. 2015;9:129-169. Aaron Aas          Passed - TSH in normal range and within 360 days    TSH  Date Value Ref Range Status  04/24/2024 1.51 0.40 - 4.50 mIU/L Final         Passed - Completed PHQ-2 or PHQ-9 in the last 360 days      Passed - Last Heart Rate in normal range    Pulse Readings from Last 1 Encounters:  04/24/24 (!) 109         Passed - Valid encounter within last 6 months    Recent Outpatient Visits           1 week ago Primary hypertension   [facility] Quinton Buckler, FNP       Future Appointments             In 2 months Gollan, Deadra Everts, MD [place] [facility] - CBC within normal limits and completed in the last 12 months    WBC  Date Value Ref Range Status  04/24/2024 7.5 3.8 - 10.8 Thousand/uL Final   RBC  Date Value Ref Range Status  04/24/2024 4.69 4.20 - 5.80 Million/uL Final   Hemoglobin  Date Value Ref Range Status  04/24/2024 13.9 13.2 - 17.1 g/dL Final   HCT  Date Value Ref Range Status  04/24/2024 41.4 38.5 - 50.0 % Final   MCHC  Date Value Ref Range Status  04/24/2024 33.6 32.0 - 36.0 g/dL Final    Comment:    For adults, a slight decrease in the calculated MCHC value (in the range of 30 to 32 g/dL) is most likely not clinically significant; however, it should be interpreted with caution in correlation with other red cell parameters and the patient's clinical condition.    [facility]  Date Value Ref Range Status  04/24/2024 29.6 27.0 - 33.0 pg Final   MCV  Date Value Ref Range Status  04/24/2024 88.3 80.0 - 100.0 fL Final   No results found for: "PLTCOUNTKUC", "LABPLAT", "POCPLA" RDW  Date Value Ref Range Status  04/24/2024 12.7  11.0 - 15.0 % Final         Passed - CMP within normal limits and completed in the last 12 months    Albumin  Date Value Ref Range Status  09/29/2007 2.8 (L)  Final   Alkaline Phosphatase  Date Value Ref Range Status  09/29/2007 125  Final   Alkaline phosphatase (APISO)  Date Value Ref Range Status  04/24/2024 69 36 - 130 U/L Final   ALT  Date Value Ref Range Status  04/24/2024 20 9 - 46 U/L Final   AST  Date Value Ref Range Status  04/24/2024 16 10 - 40 U/L Final   BUN  Date Value Ref Range Status  04/24/2024 11 7 - 25 mg/dL Final   Calcium  Date Value Ref Range Status  04/24/2024 10.1 8.6 - 10.3 mg/dL Final   CO2  Date Value Ref Range Status  04/24/2024 29 20 - 32 mmol/L Final   Creat  Date Value Ref Range Status  04/24/2024 0.92 0.60 - 1.24 mg/dL Final   Glucose, Bld  Date Value Ref Range Status  04/24/2024 99 65 - 99 mg/dL Final    Comment:    .  Fasting reference interval .    Potassium  Date Value Ref Range Status  04/24/2024 4.4 3.5 - 5.3 mmol/L Final   Sodium  Date Value Ref Range Status  04/24/2024 139 135 - 146 mmol/L Final   Total Bilirubin  Date Value Ref Range Status  04/24/2024 0.3 0.2 - 1.2 mg/dL Final   Bilirubin, Direct  Date Value Ref Range Status  09/29/2007 <0.1  Final   Indirect Bilirubin  Date Value Ref Range Status  09/29/2007 NOT CALCULATED  Final   Protein, ur  Date Value Ref Range Status  09/26/2007 NEGATIVE  Final   Total Protein  Date Value Ref Range Status  04/24/2024 7.5 6.1 - 8.1 g/dL Final   GFR calc Af Amer  Date Value Ref Range Status  09/27/2007   Final   NOT CALCULATED        The eGFR has been calculated using the MDRD equation. This calculation has not been validated in all clinical   eGFR  Date Value Ref Range Status  04/24/2024 118 > OR = 60 mL/min/1.17m2 Final   GFR calc non Af Amer  Date Value Ref Range Status  09/27/2007 NOT CALCULATED  Final          ketoconazole   (NIZORAL ) 2 % shampoo 240 mL 1    Sig: Apply to body daily for 7-14 days leave on for 5-10 minutes before rinsing off     Not Delegated - Over the Counter: OTC 2 Failed - 05/03/2024 11:18 AM      Failed - This refill cannot be delegated      Passed - Valid encounter within last 12 months    Recent Outpatient Visits           1 week ago Primary hypertension   [facility] Donny Gall F, FNP       Future Appointments             In 2 months Gollan, Timothy J, MD [place] [facility]             ergocalciferol (VITAMIN D2) 1.25 MG (50000 UT) capsule      Sig: Take 1 capsule (50,000 Units total) by mouth once a week.     Endocrinology:  Vitamins - Vitamin D Supplementation 2 Failed - 05/03/2024 11:18 AM      Failed - Manual Review: Route requests for 50,000 IU strength to the provider      Failed - Vitamin D in normal range and within 360 days    No results found for: "NW2956OZ3", "YQ6578IO9", "GE952WU1LKG", "25OHVITD3", "25OHVITD2", "25OHVITD1", "VD25OH"       Passed - Ca in normal range and within 360 days    Calcium  Date Value Ref Range Status  04/24/2024 10.1 8.6 - 10.3 mg/dL Final         Passed - Valid encounter within last 12 months    Recent Outpatient Visits           1 week ago Primary hypertension   [place] [facility] Quinton Buckler, FNP       Future Appointments             In 2 months Gollan, Timothy J, MD [place] [facility] at [facility]               Requested Prescriptions  Pending Prescriptions Disp Refills   atorvastatin (LIPITOR) 10 MG tablet      Sig: Take 1 tablet (  10 mg total) by mouth daily.     Cardiovascular:  Antilipid - Statins Failed - 05/03/2024 11:18 AM      Failed - Lipid Panel in normal range within the last 12 months    Cholesterol  Date Value Ref Range Status  04/24/2024 165 <200 mg/dL Final   LDL Cholesterol (Calc)  Date Value Ref Range Status   04/24/2024 98 mg/dL (calc) Final    Comment:    Reference range: <100 . Desirable range <100 mg/dL for primary prevention;   <70 mg/dL for patients with CHD or diabetic patients  with > or = 2 CHD risk factors. Aaron Aas LDL-C is now calculated using the Martin-Hopkins  calculation, which is a validated novel method providing  better accuracy than the Friedewald equation in the  estimation of LDL-C.  Melinda Sprawls et al. Erroll Heard. 7829;562(13): 2061-2068  (http://education.[facility].com/faq/FAQ164)    HDL  Date Value Ref Range Status  04/24/2024 29 (L) > OR = 40 mg/dL Final   Triglycerides  Date Value Ref Range Status  04/24/2024 269 (H) <150 mg/dL Final    Comment:    . If a non-fasting specimen was collected, consider repeat triglyceride testing on a fasting specimen if clinically indicated.  Imagene Mam et al. J. of Clin. Lipidol. 2015;9:129-169. Aaron Aas          Passed - Patient is not pregnant      Passed - Valid encounter within last 12 months    Recent Outpatient Visits           1 week ago Primary hypertension   [place] [facility] Donny Gall F, FNP       Future Appointments             In 2 months Gollan, Timothy J, MD [place] [facility]             cloNIDine (CATAPRES) 0.1 MG tablet 60 tablet     Sig: Take 1 tablet (0.1 mg total) by mouth 3 (three) times daily.     Cardiovascular:  Alpha-2 Agonists Failed - 05/03/2024 11:18 AM      Failed - Last BP in normal range    BP Readings from Last 1 Encounters:  04/24/24 (!) 146/78         Passed - Last Heart Rate in normal range    Pulse Readings from Last 1 Encounters:  04/24/24 (!) 109         Passed - Valid encounter within last 6 months    Recent Outpatient Visits           1 week ago Primary hypertension   [facility] Donny Gall F, FNP       Future Appointments             In 2 months Gollan, Timothy J, MD [place] [facility]  at [name]             divalproex (DEPAKOTE ER) 500 MG 24 hr tablet      Sig: Take 2 tablets (1,000 mg total) by mouth at bedtime.     Neurology:  Anticonvulsants - Valproates Failed - 05/03/2024 11:18 AM      Failed - Valproic Acid  (serum) in normal range and within 360 days    Valproic Acid  Lvl  Date Value Ref Range Status  09/27/2007 93.9  Final         Passed - AST in normal range and within 360 days  AST  Date Value Ref Range Status  04/24/2024 16 10 - 40 U/L Final         Passed - ALT in normal range and within 360 days    ALT  Date Value Ref Range Status  04/24/2024 20 9 - 46 U/L Final         Passed - HGB in normal range and within 360 days    Hemoglobin  Date Value Ref Range Status  04/24/2024 13.9 13.2 - 17.1 g/dL Final         Passed - PLT in normal range and within 360 days    Platelets  Date Value Ref Range Status  04/24/2024 313 140 - 400 Thousand/uL Final         Passed - WBC in normal range and within 360 days    WBC  Date Value Ref Range Status  04/24/2024 7.5 3.8 - 10.8 Thousand/uL Final         Passed - HCT in normal range and within 360 days    HCT  Date Value Ref Range Status  04/24/2024 41.4 38.5 - 50.0 % Final         Passed - Completed PHQ-2 or PHQ-9 in the last 360 days      Passed - Patient is not pregnant      Passed - Valid encounter within last 12 months    Recent Outpatient Visits           1 week ago Primary hypertension   [place] [facility] Donny Gall F, FNP       Future Appointments             In 2 months Gollan, Timothy J, MD [place] [facility]             DULoxetine (CYMBALTA) 20 MG capsule      Sig: Take 1 capsule (20 mg total) by mouth 2 (two) times daily.     Psychiatry: Antidepressants - SNRI - duloxetine Failed - 05/03/2024 11:18 AM      Failed - Last BP in normal range    BP Readings from Last 1 Encounters:  04/24/24 (!) 146/78         Passed - Cr  in normal range and within 360 days    Creat  Date Value Ref Range Status  04/24/2024 0.92 0.60 - 1.24 mg/dL Final         Passed - eGFR is 30 or above and within 360 days    GFR calc Af Amer  Date Value Ref Range Status  09/27/2007   Final   NOT CALCULATED        The eGFR has been calculated using the MDRD equation. This calculation has not been validated in all clinical   GFR calc non Af Amer  Date Value Ref Range Status  09/27/2007 NOT CALCULATED  Final   eGFR  Date Value Ref Range Status  04/24/2024 118 > OR = 60 mL/min/1.72m2 Final         Passed - Completed PHQ-2 or PHQ-9 in the last 360 days      Passed - Valid encounter within last 6 months    Recent Outpatient Visits           1 week ago Primary hypertension   [facility] Quinton Buckler, FNP       Future Appointments  In 2 months Gollan, Timothy J, MD [facility]             lisinopril (ZESTRIL) 10 MG tablet      Sig: Take 1 tablet (10 mg total) by mouth daily.     Cardiovascular:  ACE Inhibitors Failed - 05/03/2024 11:18 AM      Failed - Last BP in normal range    BP Readings from Last 1 Encounters:  04/24/24 (!) 146/78         Passed - Cr in normal range and within 180 days    Creat  Date Value Ref Range Status  04/24/2024 0.92 0.60 - 1.24 mg/dL Final         Passed - K in normal range and within 180 days    Potassium  Date Value Ref Range Status  04/24/2024 4.4 3.5 - 5.3 mmol/L Final         Passed - Patient is not pregnant      Passed - Valid encounter within last 6 months    Recent Outpatient Visits           1 week ago Primary hypertension   [place] [facility] Donny Gall F, FNP       Future Appointments             In 2 months Gollan, Timothy J, MD [place] [facility]             OLANZapine (ZYPREXA) 10 MG tablet      Sig: Take 1 tablet (10 mg total) by mouth at  bedtime.     Not Delegated - Psychiatry:  Antipsychotics - Second Generation (Atypical) - olanzapine Failed - 05/03/2024 11:18 AM      Failed - This refill cannot be delegated      Failed - Last BP in normal range    BP Readings from Last 1 Encounters:  04/24/24 (!) 146/78         Failed - Lipid Panel in normal range within the last 12 months    Cholesterol  Date Value Ref Range Status  04/24/2024 165 <200 mg/dL Final   LDL Cholesterol (Calc)  Date Value Ref Range Status  04/24/2024 98 mg/dL (calc) Final    Comment:    Reference range: <100 . Desirable range <100 mg/dL for primary prevention;   <70 mg/dL for patients with CHD or diabetic patients  with > or = 2 CHD risk factors. Aaron Aas LDL-C is now calculated using the Martin-Hopkins  calculation, which is a validated novel method providing  better accuracy than the Friedewald equation in the  estimation of LDL-C.  Melinda Sprawls et al. Erroll Heard. 5284;132(44): 2061-2068  (http://education.[facility].com/faq/FAQ164)    HDL  Date Value Ref Range Status  04/24/2024 29 (L) > OR = 40 mg/dL Final   Triglycerides  Date Value Ref Range Status  04/24/2024 269 (H) <150 mg/dL Final    Comment:    . If a non-fasting specimen was collected, consider repeat triglyceride testing on a fasting specimen if clinically indicated.  Imagene Mam et al. J. of Clin. Lipidol. 2015;9:129-169. Aaron Aas          Passed - TSH in normal range and within 360 days    TSH  Date Value Ref Range Status  04/24/2024 1.51 0.40 - 4.50 mIU/L Final         Passed - Completed PHQ-2 or PHQ-9 in the last 360 days  Passed - Last Heart Rate in normal range    Pulse Readings from Last 1 Encounters:  04/24/24 (!) 109         Passed - Valid encounter within last 6 months    Recent Outpatient Visits           1 week ago Primary hypertension   [place] [facility] Quinton Buckler, FNP       Future Appointments             In 2 months  Gollan, Deadra Everts, MD [place] [facility] at [facility] - CBC within normal limits and completed in the last 12 months    WBC  Date Value Ref Range Status  04/24/2024 7.5 3.8 - 10.8 Thousand/uL Final   RBC  Date Value Ref Range Status  04/24/2024 4.69 4.20 - 5.80 Million/uL Final   Hemoglobin  Date Value Ref Range Status  04/24/2024 13.9 13.2 - 17.1 g/dL Final   HCT  Date Value Ref Range Status  04/24/2024 41.4 38.5 - 50.0 % Final   MCHC  Date Value Ref Range Status  04/24/2024 33.6 32.0 - 36.0 g/dL Final    Comment:    For adults, a slight decrease in the calculated MCHC value (in the range of 30 to 32 g/dL) is most likely not clinically significant; however, it should be interpreted with caution in correlation with other red cell parameters and the patient's clinical condition.    [facility]  Date Value Ref Range Status  04/24/2024 29.6 27.0 - 33.0 pg Final   MCV  Date Value Ref Range Status  04/24/2024 88.3 80.0 - 100.0 fL Final   No results found for: "PLTCOUNTKUC", "LABPLAT", "POCPLA" RDW  Date Value Ref Range Status  04/24/2024 12.7 11.0 - 15.0 % Final         Passed - CMP within normal limits and completed in the last 12 months    Albumin  Date Value Ref Range Status  09/29/2007 2.8 (L)  Final   Alkaline Phosphatase  Date Value Ref Range Status  09/29/2007 125  Final   Alkaline phosphatase (APISO)  Date Value Ref Range Status  04/24/2024 69 36 - 130 U/L Final   ALT  Date Value Ref Range Status  04/24/2024 20 9 - 46 U/L Final   AST  Date Value Ref Range Status  04/24/2024 16 10 - 40 U/L Final   BUN  Date Value Ref Range Status  04/24/2024 11 7 - 25 mg/dL Final   Calcium  Date Value Ref Range Status  04/24/2024 10.1 8.6 - 10.3 mg/dL Final   CO2  Date Value Ref Range Status  04/24/2024 29 20 - 32 mmol/L Final   Creat  Date Value Ref Range Status  04/24/2024 0.92 0.60 - 1.24 mg/dL Final   Glucose, Bld  Date Value  Ref Range Status  04/24/2024 99 65 - 99 mg/dL Final    Comment:    .            Fasting reference interval .    Potassium  Date Value Ref Range Status  04/24/2024 4.4 3.5 - 5.3 mmol/L Final   Sodium  Date Value Ref Range Status  04/24/2024 139 135 - 146 mmol/L Final   Total Bilirubin  Date Value Ref Range Status  04/24/2024 0.3 0.2 - 1.2 mg/dL Final   Bilirubin, Direct  Date Value Ref Range Status  09/29/2007 <0.1  Final   Indirect Bilirubin  Date Value Ref Range Status  09/29/2007 NOT CALCULATED  Final   Protein, ur  Date Value Ref Range Status  09/26/2007 NEGATIVE  Final   Total Protein  Date Value Ref Range Status  04/24/2024 7.5 6.1 - 8.1 g/dL Final   GFR calc Af Amer  Date Value Ref Range Status  09/27/2007   Final   NOT CALCULATED        The eGFR has been calculated using the MDRD equation. This calculation has not been validated in all clinical   eGFR  Date Value Ref Range Status  04/24/2024 118 > OR = 60 mL/min/1.28m2 Final   GFR calc non Af Amer  Date Value Ref Range Status  09/27/2007 NOT CALCULATED  Final          ketoconazole  (NIZORAL ) 2 % shampoo 240 mL 1    Sig: Apply to body daily for 7-14 days leave on for 5-10 minutes before rinsing off     Not Delegated - Over the Counter: OTC 2 Failed - 05/03/2024 11:18 AM      Failed - This refill cannot be delegated      Passed - Valid encounter within last 12 months    Recent Outpatient Visits           1 week ago Primary hypertension   [facility] Donny Gall F, FNP       Future Appointments             In 2 months Gollan, Timothy J, MD [facility]             ergocalciferol (VITAMIN D2) 1.25 MG (50000 UT) capsule      Sig: Take 1 capsule (50,000 Units total) by mouth once a week.     Endocrinology:  Vitamins - Vitamin D Supplementation 2 Failed - 05/03/2024 11:18 AM      Failed - Manual Review: Route requests for 50,000 IU  strength to the provider      Failed - Vitamin D in normal range and within 360 days    No results found for: "WJ1914NW2", "NF6213YQ6", "VH846NG2XBM", "25OHVITD3", "25OHVITD2", "25OHVITD1", "VD25OH"       Passed - Ca in normal range and within 360 days    Calcium  Date Value Ref Range Status  04/24/2024 10.1 8.6 - 10.3 mg/dL Final         Passed - Valid encounter within last 12 months    Recent Outpatient Visits           1 week ago Primary hypertension   [facility] Quinton Buckler, FNP       Future Appointments             In 2 months Gollan, Timothy J, MD [facility]

## 2024-05-16 ENCOUNTER — Telehealth: Payer: Self-pay

## 2024-05-16 NOTE — Telephone Encounter (Signed)
 Ok to write?

## 2024-05-16 NOTE — Telephone Encounter (Signed)
 Letter written placed up front for pickup

## 2024-05-16 NOTE — Telephone Encounter (Signed)
 Copied from CRM [id number]. Topic: Clinical - Medical Advice >> May 16, 2024  3:36 PM Sasha H wrote: Reason for CRM: Rise Cheng states that they need a letter stating that this pt does need personal care services by tomorrow.

## 2024-05-17 ENCOUNTER — Telehealth: Payer: Self-pay | Admitting: Nurse Practitioner

## 2024-05-17 DIAGNOSIS — Z79899 Other long term (current) drug therapy: Secondary | ICD-10-CM | POA: Diagnosis not present

## 2024-05-17 NOTE — Telephone Encounter (Signed)
 Placed upfront and notified

## 2024-05-17 NOTE — Telephone Encounter (Signed)
 Copied from CRM [id number]. Topic: Clinical - Medical Advice >> May 16, 2024  3:36 PM Sasha H wrote: Reason for CRM: Rise Cheng states that they need a letter stating that this pt does need personal care services by tomorrow. >> May 17, 2024 11:14 AM Rosamond Comes wrote: Lovette Rud calling stating the letter that was pick up by Ellis Guys 05/17/24, the letter is incorrect and needs corrected today.

## 2024-05-17 NOTE — Telephone Encounter (Signed)
 Kathaleen Pale (caretaker) states that the letter that was given earlier today was not written correctly. They are needing it to say:  Pt is needing Personal Care Services for  ADL (activity of daily living) Hands on Assistance Required Bathing and Dressing Mobility And patient is not able to cook for himself  They are wanting to pick this up today because it is due today. Please contact Kathaleen Pale [phone] when it is ready

## 2024-05-21 ENCOUNTER — Telehealth: Payer: Self-pay | Admitting: Nurse Practitioner

## 2024-05-21 NOTE — Telephone Encounter (Signed)
 Copied from CRM [id number]. Topic: General - Other >> May 21, 2024  1:20 PM Felizardo Hotter wrote: Reason for CRM: Received call from [facility] ph: [phone] and fax: [phone] group home personal services, needs letter that Donny Gall NP wrote for pt faxed to [phone]

## 2024-05-21 NOTE — Telephone Encounter (Signed)
 Copied from CRM [id number]. Topic: Clinical - Medical Advice >> May 16, 2024  3:36 PM Sasha H wrote: Reason for CRM: Rise Cheng states that they need a letter stating that this pt does need personal care services by tomorrow. >> May 17, 2024 11:14 AM Rosamond Comes wrote: Lovette Rud calling stating the letter that was pick up by Ellis Guys 05/17/24, the letter is incorrect and needs corrected today.

## 2024-05-21 NOTE — Telephone Encounter (Signed)
 Copied from CRM [id number]. Topic: Referral - Request for Referral >> May 21, 2024  1:13 PM Felizardo Hotter wrote: Did the patient discuss referral with their provider in the last year? Yes (If No - schedule appointment) (If Yes - send message)  Appointment offered? Yes  Type of order/referral and detailed reason for visit: Personal Care Services  Preference of office, provider, location: Please contact Amadeo Backers ph: [phone] and fax: [phone] for group home personal services.    If referral order, have you been seen by this specialty before? No (If Yes, this issue or another issue? When? Where?  Can we respond through MyChart? No

## 2024-05-21 NOTE — Telephone Encounter (Signed)
 Letter faxed to number provided.

## 2024-05-30 DIAGNOSIS — Z79899 Other long term (current) drug therapy: Secondary | ICD-10-CM | POA: Diagnosis not present

## 2024-05-31 ENCOUNTER — Telehealth: Payer: Self-pay

## 2024-05-31 DIAGNOSIS — F988 Other specified behavioral and emotional disorders with onset usually occurring in childhood and adolescence: Secondary | ICD-10-CM

## 2024-05-31 DIAGNOSIS — F39 Unspecified mood [affective] disorder: Secondary | ICD-10-CM

## 2024-05-31 NOTE — Telephone Encounter (Signed)
 Copied from CRM [id number]. Topic: Referral - Prior Authorization Question >> May 31, 2024  1:22 PM Rosamond Comes wrote: Reason for CRM: Rise Cheng [phone] patient  manager calling asking for referral for personal care services.  The referral is required to be faxed to [facility] phone number [phone]  and fax number    Gillian Lacrosse will call back with the fax number

## 2024-06-03 ENCOUNTER — Telehealth: Payer: Self-pay

## 2024-06-03 NOTE — Progress Notes (Signed)
 Complex Care Management Note  Care Guide Note 06/03/2024 Name: Ivan [name] MRN: [id number] DOB: [birth year]  Ivan [name] is a 23 y.o. year old male who sees Quinton Buckler, FNP for primary care. I reached out to Stephanie Ego by phone today to offer complex care management services.  Ivan [name] was given information about Complex Care Management services today including:   The Complex Care Management services include support from the care team which includes your Nurse Care Manager, Clinical Social Worker, or Pharmacist.  The Complex Care Management team is here to help remove barriers to the health concerns and goals most important to you. Complex Care Management services are voluntary, and the patient may decline or stop services at any time by request to their care team member.   Complex Care Management Consent Status: Patients care taker shared referral placed incorrectly after information of VBCI services shared.  Follow up plan:  Rise Cheng will follow up with PCP.  [facility]  [facility], [facility] Care Management Assistant Direct Dial: [phone]  Fax: [phone]

## 2024-06-03 NOTE — Progress Notes (Signed)
 Complex Care Management Note Care Guide Note  06/03/2024 Name: Ivan [name] MRN: [id number] DOB: [birth year]   Complex Care Management Outreach Attempts: An unsuccessful telephone outreach was attempted today to offer the patient information about available complex care management services.  Follow Up Plan:  Additional outreach attempts will be made to offer the patient complex care management information and services.   Encounter Outcome:  No Answer  [facility]  [facility], [facility] Care Management Assistant Direct Dial: [phone]  Fax: [phone]

## 2024-06-04 NOTE — Telephone Encounter (Signed)
 Copied from CRM [id number]. Topic: Referral - Status >> Jun 04, 2024 11:13 AM Georgeann Kindred wrote: Reason for CRM: Rise Cheng, [facility], calling stated that the referral for patient to have PCS (Personal Care Services) was sent to the incorrect location. Referral is to be sent to [facility]. She does not have the phone nor fax number to provide for the place. She stated that this referral has been completed for may of their other clients. Please contact Kristie at [phone] for additional information.

## 2024-06-04 NOTE — Telephone Encounter (Signed)
 Copied from CRM [id number]. Topic: General - Other >> Jun 04, 2024 11:51 AM Georgeann Kindred wrote: Reason for CRM: CORRECTION: Rise Cheng, [facility], calling stated that the referral for patient to have PCS (Personal Care Services) was sent to the incorrect location. [facility] PHONE: [phone] FAX: [phone]. Please contact Kristie at [phone] for additional information.

## 2024-06-04 NOTE — Telephone Encounter (Signed)
 Forms refaxed

## 2024-06-20 DIAGNOSIS — Z79899 Other long term (current) drug therapy: Secondary | ICD-10-CM | POA: Diagnosis not present

## 2024-07-08 NOTE — Progress Notes (Unsigned)
 Cardiology Office Note  Date:  07/09/2024   ID:  Solan Vosler, DOB 09/11/2001, MRN [id number]  PCP:  Gareth Mliss FALCON, FNP   Chief Complaint  Patient presents with   New Patient (Initial Visit)    Ref by Mliss Gareth, FNP for a family history of cardiac arrest.     HPI:  Mr. Ivan [name] is a 23 year old gentleman with past medical history of Mood disorder Attention deficit Hyperlipidemia Hypertension Obesity Lives in group home, day program Smoker 3 a day, and vapes Who presents by referral from Mliss Gareth for consultation of his family history of cardiac disease, shortness of breath on exertion  On discussion today, Mr. 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  PSH:    Past Surgical History:  Procedure Laterality Date   TYMPANOSTOMY TUBE PLACEMENT Bilateral    WRIST SURGERY Left     Current Outpatient Medications  Medication Sig Dispense Refill   atorvastatin  (LIPITOR) 10 MG tablet Take 1 tablet (10 mg  total) by mouth daily. 90 tablet 1   cloNIDine  (CATAPRES ) 0.1 MG tablet Take 1 tablet (0.1 mg total) by mouth 3 (three) times daily. 360 tablet 0   divalproex (DEPAKOTE ER) 500 MG 24 hr tablet Take 1,000 mg by mouth at bedtime.     DULoxetine (CYMBALTA) 20 MG capsule Take 20 mg by mouth 2 (two) times daily.     ergocalciferol  (VITAMIN D2) 1.25 MG (50000 UT) capsule Take 1 capsule (50,000 Units total) by mouth once a week. 12 capsule 0   ketoconazole  (NIZORAL ) 2 % shampoo Apply to body daily for 7-14 days leave on for 5-10 minutes before rinsing off 240 mL 1   lisinopril  (ZESTRIL ) 10 MG tablet Take 1 tablet (10 mg total) by mouth daily. 90 tablet 1   OLANZapine (ZYPREXA) 10 MG tablet Take 10 mg by mouth at bedtime.     No current facility-administered medications for this visit.    Allergies:   Bupropion, Guanfacine, and Lisdexamfetamine   Social History:  The patient  reports that he has been smoking cigarettes. He does not have any smokeless tobacco history on file. He reports that he does not currently use alcohol. He reports that he does not currently use drugs after having used the following drugs: Marijuana.   Family History:   family history includes Bradycardia in his mother; Heart attack (age of onset: 66) in his father; Heart attack (age of onset: 29) in his maternal grandmother; Heart disease in his father; Supraventricular tachycardia in his sister; Valvular heart disease in his mother.  Review of Systems: Review of Systems  Constitutional: Negative.   HENT: Negative.    Respiratory: Negative.    Cardiovascular: Negative.   Gastrointestinal: Negative.   Musculoskeletal: Negative.   Neurological: Negative.   Psychiatric/Behavioral: Negative.    All other systems reviewed and are negative.   PHYSICAL EXAM: VS:  BP 120/70 (BP Location: Right Arm, Patient Position: Sitting, Cuff Size: Large)   Pulse 82   Ht 6' 4 (1.93 m)   Wt (!) 345 lb 4 oz (156.6 kg)   SpO2 98%   BMI  42.03 kg/m  , BMI Body mass index is 42.03 kg/m. GEN: Well nourished, well developed, in no acute distress HEENT: normal Neck: no JVD, carotid bruits, or masses Cardiac: RRR; no murmurs, rubs, or gallops,no edema  Respiratory:  clear to auscultation bilaterally, normal work of breathing GI: soft, nontender, nondistended, + BS MS: no deformity or atrophy Skin: warm and dry, no rash Neuro:  Strength and sensation are intact Psych: euthymic mood, full affect  Recent Labs: 04/24/2024: ALT 20; BUN 11; Creat 0.92; Hemoglobin 13.9; Platelets 313; Potassium 4.4; Sodium 139; TSH 1.51    Lipid Panel Lab Results  Component Value Date   CHOL 165 04/24/2024   HDL 29 (L) 04/24/2024   LDLCALC 98 04/24/2024   TRIG 269 (H) 04/24/2024      Wt Readings from Last 3 Encounters:  07/09/24 (!) 345 lb 4 oz (156.6 kg)  04/24/24 (!) 341 lb 4.8 oz (154.8 kg)  06/17/21 235 lb (106.6 kg)       ASSESSMENT AND PLAN:  Problem List Items Addressed This Visit       Cardiology Problems   Primary hypertension - Primary   Relevant Orders   EKG 12-Lead (Completed)   Mixed hyperlipidemia     Other   Episodic mood disorder (HCC)   Attention deficit disorder of childhood   Family history cardiac disease Stressed importance of smoking cessation Cholesterol reasonable on current medication Blood pressure well-controlled No significant diabetes Lifestyle modification recommended  Shortness of breath Reports having some shortness of breath on heavy exertion, lifting Recommend regular exercise program for conditioning -Echocardiogram ordered for baseline study to rule out structural heart disease  Essential hypertension Blood pressure is well controlled on today's visit. No changes made to the medications.  Episodic mood disorder On Depakote, Cymbalta Appropriate, conversant  Mr. Goshert was seen in consultation for Mliss Spray and will be referred back to her office for ongoing care of the  issues detailed above  Signed, Velinda Lunger, M.D., Ph.D. [facility] [place], [place] [phone]

## 2024-07-09 ENCOUNTER — Encounter: Payer: Self-pay | Admitting: Cardiovascular Disease

## 2024-07-09 ENCOUNTER — Ambulatory Visit: Attending: Cardiovascular Disease | Admitting: Cardiovascular Disease

## 2024-07-09 VITALS — BP 120/70 | HR 82 | Ht 76.0 in | Wt 345.2 lb

## 2024-07-09 DIAGNOSIS — R0602 Shortness of breath: Secondary | ICD-10-CM

## 2024-07-09 DIAGNOSIS — I10 Essential (primary) hypertension: Secondary | ICD-10-CM | POA: Diagnosis not present

## 2024-07-09 DIAGNOSIS — Z8249 Family history of ischemic heart disease and other diseases of the circulatory system: Secondary | ICD-10-CM | POA: Diagnosis not present

## 2024-07-09 DIAGNOSIS — E782 Mixed hyperlipidemia: Secondary | ICD-10-CM

## 2024-07-09 DIAGNOSIS — F988 Other specified behavioral and emotional disorders with onset usually occurring in childhood and adolescence: Secondary | ICD-10-CM

## 2024-07-09 DIAGNOSIS — F39 Unspecified mood [affective] disorder: Secondary | ICD-10-CM

## 2024-07-09 NOTE — Patient Instructions (Addendum)

## 2024-07-24 ENCOUNTER — Other Ambulatory Visit: Payer: Self-pay | Admitting: Nurse Practitioner

## 2024-07-25 NOTE — Telephone Encounter (Signed)
 Requested medications are due for refill today.  yes  Requested medications are on the active medications list.  yes  Last refill. 05/03/2024 #12 0 rf  Future visit scheduled.   yes  Notes to clinic.  Refill not delegated.    Requested Prescriptions  Pending Prescriptions Disp Refills   Vitamin D , Ergocalciferol , (DRISDOL ) 1.25 MG (50000 UNIT) CAPS capsule [Pharmacy Med Name: Vitamin D  (Ergocalciferol ) 1.25 MG (50000 UT) Capsule] 1 capsule 10    Sig: TAKE 1 CAPSULE BY MOUTH ONCE A WEEK     Endocrinology:  Vitamins - Vitamin D  Supplementation 2 Failed - 07/25/2024  2:21 PM      Failed - Manual Review: Route requests for 50,000 IU strength to the provider      Failed - Vitamin D  in normal range and within 360 days    No results found for: [id number], CI6874NY7, [id number], 25OHVITD3, 25OHVITD2, 25OHVITD1, VD25OH       Passed - Ca in normal range and within 360 days    Calcium   Date Value Ref Range Status  04/24/2024 10.1 8.6 - 10.3 mg/dL Final         Passed - Valid encounter within last 12 months    Recent Outpatient Visits           3 months ago Primary hypertension   [facility]areth Mliss FAL[place], [place]

## 2024-08-06 ENCOUNTER — Telehealth: Payer: Self-pay

## 2024-08-06 ENCOUNTER — Other Ambulatory Visit: Payer: Self-pay | Admitting: Nurse Practitioner

## 2024-08-06 NOTE — Telephone Encounter (Signed)
 Copied from CRM [id number]. Topic: Clinical - Medication Question >> Aug 06, 2024  3:27 PM Antwanette L wrote: Reason for CRM: Khademia  from [facility] is requesting a refill on Vitamin D  1.25 MG. Please contact [facility] at [phone]. >> Aug 06, 2024  3:44 PM RMA Sherrilyn PARAS wrote: Order sent to [facility] fore refill >> Aug 06, 2024  3:33 PM Antwanette L wrote: The medicine is not listed on the pt chart

## 2024-08-15 ENCOUNTER — Telehealth: Payer: Self-pay

## 2024-08-15 MED ORDER — LISINOPRIL 10 MG PO TABS: 10.0000 mg | ORAL_TABLET | Freq: Every day | ORAL | 1 refills | Status: AC

## 2024-08-15 NOTE — Telephone Encounter (Signed)
 Pharmacy Quality Measure Review  This patient is appearing on a report for being at risk of failing the adherence measure for hypertension (ACEi/ARB) medications this calendar year.   Medication: lisinopril   Last fill date: 07/30/24 for 29 day supply  Insurance report was not up to date. Per pharmacy, no refills left on file. Will collaborate with provider to facilitate refill needs.   Hagen Bohorquez E. Marsh, PharmD Clinical Pharmacist [facility] [phone]

## 2024-08-15 NOTE — Addendum Note (Signed)
 Addended by: YVONE WARREN BROCKS on: 08/15/2024 10:18 AM   Modules accepted: Orders

## 2024-08-15 NOTE — Telephone Encounter (Signed)
 RX sent

## 2024-08-20 ENCOUNTER — Other Ambulatory Visit

## 2024-08-20 ENCOUNTER — Other Ambulatory Visit: Payer: Self-pay | Admitting: Nurse Practitioner

## 2024-08-22 NOTE — Telephone Encounter (Signed)
 Requested Prescriptions  Pending Prescriptions Disp Refills   cloNIDine  (CATAPRES ) 0.1 MG tablet [Pharmacy Med Name: cloNIDine  HCl 0.1 MG Tablet] 270 tablet 0    Sig: TAKE 1 TABLET BY MOUTH THREE TIMES A DAY     Cardiovascular:  Alpha-2 Agonists Passed - 08/22/2024 10:33 AM      Passed - Last BP in normal range    BP Readings from Last 1 Encounters:  07/09/24 120/70         Passed - Last Heart Rate in normal range    Pulse Readings from Last 1 Encounters:  07/09/24 82         Passed - Valid encounter within last 6 months    Recent Outpatient Visits           4 months ago Primary hypertension   [facility]areth Mliss FAL[place], [place]

## 2024-08-27 ENCOUNTER — Encounter: Payer: Self-pay | Admitting: Nurse Practitioner

## 2024-08-27 ENCOUNTER — Ambulatory Visit (INDEPENDENT_AMBULATORY_CARE_PROVIDER_SITE_OTHER): Admitting: Nurse Practitioner

## 2024-08-27 VITALS — BP 128/72 | HR 98 | Temp 98.8°F | Resp 18 | Ht 76.0 in | Wt 343.6 lb

## 2024-08-27 DIAGNOSIS — E782 Mixed hyperlipidemia: Secondary | ICD-10-CM

## 2024-08-27 DIAGNOSIS — F988 Other specified behavioral and emotional disorders with onset usually occurring in childhood and adolescence: Secondary | ICD-10-CM | POA: Diagnosis not present

## 2024-08-27 DIAGNOSIS — I10 Essential (primary) hypertension: Secondary | ICD-10-CM | POA: Diagnosis not present

## 2024-08-27 DIAGNOSIS — F39 Unspecified mood [affective] disorder: Secondary | ICD-10-CM | POA: Diagnosis not present

## 2024-08-27 MED ORDER — ATORVASTATIN CALCIUM 10 MG PO TABS
10.0000 mg | ORAL_TABLET | Freq: Every day | ORAL | 1 refills | Status: AC
Start: 2024-08-27 — End: ?

## 2024-08-27 NOTE — Progress Notes (Signed)
 BP 128/72   Pulse 98   Temp 98.8 F (37.1 C)   Resp 18   Ht 6' 4 (1.93 m)   Wt (!) 343 lb 9.6 oz (155.9 kg)   SpO2 99%   BMI 41.82 kg/m    Subjective:    Patient ID: Ivan [name], male    DOB: July 26, 2001, 23 y.o.   MRN: [id number]  HPI: Ivan [name] is a 23 y.o. male  Chief Complaint  Patient presents with   Medical Management of Chronic Issues    Discussed the use of AI scribe software for clinical note transcription with the patient, who gave verbal consent to proceed.  History of Present Illness Ivan [name] is a 23 year old male with primary hypertension, mood disorder, ADD, hyperlipidemia, and obesity who presents for a six-month follow-up.  Hypertension and cardiovascular symptoms - Primary hypertension with previous blood pressure of 146/78, now improved to 128/72 - Elevated pulse at home, often in the hundreds - Family history of cardiac arrest - Referral to cardiology made; heart exam canceled and rescheduled - No chest pain, shortness of breath, or palpitations reported  Obesity and metabolic findings - Obesity with current weight of 343 pounds and BMI of 41.82 - Last recorded weight was 341 pounds with BMI of 42.66 - Waist circumference is 55 inches  Neuropsychiatric symptoms and stress - Mood disorder and attention deficit disorder (ADD) - Nervousness and shaking attributed to financial stress  Tobacco use - Started smoking at age 57 - Uses smoking as a coping mechanism for stress - Considering quitting smoking - Previously tried nicotine gum, purchased by his mother     [employer] Office Visit from 08/27/2024 in [facility]  1 55 inches   Body mass index is 41.82 kg/m.  Filed Weights   08/27/24 1046  Weight: (!) 343 lb 9.6 oz (155.9 kg)       08/27/2024   10:51 AM 04/24/2024    1:29 PM  Depression screen PHQ 2/9  Decreased Interest 0 0  Down, Depressed, Hopeless 0 0  PHQ - 2 Score 0 0  Altered sleeping 0 0   Tired, decreased energy 0 0  Change in appetite 0 0  Feeling bad or failure about yourself  0 0  Trouble concentrating 0 0  Moving slowly or fidgety/restless 0 0  Suicidal thoughts 0 0  PHQ-9 Score 0 0  Difficult doing work/chores Not difficult at all Not difficult at all    Relevant past medical, surgical, family and social history reviewed and updated as indicated. Interim medical history since our last visit reviewed. Allergies and medications reviewed and updated.  Review of Systems  Ten systems reviewed and is negative except as mentioned in HPI      Objective:     BP 128/72   Pulse 98   Temp 98.8 F (37.1 C)   Resp 18   Ht 6' 4 (1.93 m)   Wt (!) 343 lb 9.6 oz (155.9 kg)   SpO2 99%   BMI 41.82 kg/m    Wt Readings from Last 3 Encounters:  08/27/24 (!) 343 lb 9.6 oz (155.9 kg)  07/09/24 (!) 345 lb 4 oz (156.6 kg)  04/24/24 (!) 341 lb 4.8 oz (154.8 kg)    Physical Exam Physical Exam VITALS: BP- 128/72 MEASUREMENTS: Weight- 343, BMI- 41.82. GENERAL: Alert, cooperative, well developed, no acute distress HEENT: Normocephalic, normal oropharynx, moist mucous membranes CHEST: Clear to auscultation bilaterally, no wheezes, rhonchi, or  crackles CARDIOVASCULAR: Normal heart rate and rhythm, S1 and S2 normal without murmurs ABDOMEN: Soft, non-tender, non-distended, without organomegaly, normal bowel sounds EXTREMITIES: No cyanosis or edema NEUROLOGICAL: Cranial nerves grossly intact, moves all extremities without gross motor or sensory deficit   Results for orders placed or performed in visit on 04/24/24  CBC with Differential/Platelet   Collection Time: 04/24/24  1:57 PM  Result Value Ref Range   WBC 7.5 3.8 - 10.8 Thousand/uL   RBC 4.69 4.20 - 5.80 Million/uL   Hemoglobin 13.9 13.2 - 17.1 g/dL   HCT 58.5 61.4 - 49.9 %   MCV 88.3 80.0 - 100.0 fL   MCH 29.6 27.0 - 33.0 pg   MCHC 33.6 32.0 - 36.0 g/dL   RDW 87.2 88.9 - 84.9 %   Platelets 313 140 - 400  Thousand/uL   MPV 11.6 7.5 - 12.5 fL   Neutro Abs 3,720 1,500 - 7,800 cells/uL   Absolute Lymphocytes 2,873 850 - 3,900 cells/uL   Absolute Monocytes 728 200 - 950 cells/uL   Eosinophils Absolute 120 15 - 500 cells/uL   Basophils Absolute 60 0 - 200 cells/uL   Neutrophils Relative % 49.6 %   Total Lymphocyte 38.3 %   Monocytes Relative 9.7 %   Eosinophils Relative 1.6 %   Basophils Relative 0.8 %  Comprehensive metabolic panel with GFR   Collection Time: 04/24/24  1:57 PM  Result Value Ref Range   Glucose, Bld 99 65 - 99 mg/dL   BUN 11 7 - 25 mg/dL   Creat 9.07 9.39 - 8.75 mg/dL   eGFR 881 > OR = 60 fO/fpw/8.26f7   BUN/Creatinine Ratio SEE NOTE: 6 - 22 (calc)   Sodium 139 135 - 146 mmol/L   Potassium 4.4 3.5 - 5.3 mmol/L   Chloride 101 98 - 110 mmol/L   CO2 29 20 - 32 mmol/L   Calcium  10.1 8.6 - 10.3 mg/dL   Total Protein 7.5 6.1 - 8.1 g/dL   Albumin 4.5 3.6 - 5.1 g/dL   Globulin 3.0 1.9 - 3.7 g/dL (calc)   AG Ratio 1.5 1.0 - 2.5 (calc)   Total Bilirubin 0.3 0.2 - 1.2 mg/dL   Alkaline phosphatase (APISO) 69 36 - 130 U/L   AST 16 10 - 40 U/L   ALT 20 9 - 46 U/L  Hemoglobin A1c   Collection Time: 04/24/24  1:57 PM  Result Value Ref Range   Hgb A1c MFr Bld 5.5 <5.7 %   Mean Plasma Glucose 111 mg/dL   eAG (mmol/L) 6.2 mmol/L  Hepatitis C antibody   Collection Time: 04/24/24  1:57 PM  Result Value Ref Range   Hepatitis C Ab NON-REACTIVE NON-REACTIVE  HIV Antibody (routine testing w rflx)   Collection Time: 04/24/24  1:57 PM  Result Value Ref Range   HIV 1&2 Ab, 4th Generation NON-REACTIVE NON-REACTIVE  Lipid panel   Collection Time: 04/24/24  1:57 PM  Result Value Ref Range   Cholesterol 165 <200 mg/dL   HDL 29 (L) > OR = 40 mg/dL   Triglycerides 730 (H) <150 mg/dL   LDL Cholesterol (Calc) 98 mg/dL (calc)   Total CHOL/HDL Ratio 5.7 (H) <5.0 (calc)   Non-HDL Cholesterol (Calc) 136 (H) <130 mg/dL (calc)  TSH   Collection Time: 04/24/24  1:57 PM  Result Value Ref  Range   TSH 1.51 0.40 - 4.50 mIU/L  Lipoprotein A (LPA)   Collection Time: 04/24/24  1:57 PM  Result Value Ref Range  Lipoprotein (a) 10 <75 nmol/L          Assessment & Plan:   Problem List Items Addressed This Visit       Cardiovascular and Mediastinum   Primary hypertension - Primary   Relevant Medications   atorvastatin  (LIPITOR) 10 MG tablet     Other   Episodic mood disorder (HCC)   Attention deficit disorder of childhood   Mixed hyperlipidemia   Relevant Medications   atorvastatin  (LIPITOR) 10 MG tablet     Assessment and Plan Assessment & Plan Essential hypertension Blood pressure is well-controlled at 128/72 mmHg, improved from 146/78 mmHg. He reports perceived hypertension due to stress from financial issues and a high pulse rate at home, often in the hundreds.  Mixed hyperlipidemia Currently on atorvastatin  10 mg daily. Last refill was in May, and a refill may be needed soon.  Obesity Current weight is 343 pounds with a BMI of 41.82, indicating obesity. Previous weight was 341 pounds with a BMI of 42.66. Waist circumference is 55 inches.  Unspecified mood disorder Currently on Depakote 1000 mg at bedtime, Cymbalta 20 mg twice daily, and Zyprexa 10 mg at bedtime.  Attention deficit disorder Currently on clonidine  0.1 mg three times a day.  Nicotine dependence Considering quitting smoking but finds it challenging due to stress. He has tried nicotine gum in the past but has not yet attempted to quit. Aware of the option to use nicotine patches in conjunction with gum. - Offer nicotine patches if needed.        Follow up plan: Return in about 6 months (around 02/24/2025) for follow up.

## 2024-09-04 DIAGNOSIS — Z79899 Other long term (current) drug therapy: Secondary | ICD-10-CM | POA: Diagnosis not present

## 2024-09-10 ENCOUNTER — Ambulatory Visit: Attending: Cardiovascular Disease

## 2024-10-02 DIAGNOSIS — Z79899 Other long term (current) drug therapy: Secondary | ICD-10-CM | POA: Diagnosis not present

## 2024-10-03 DIAGNOSIS — R0981 Nasal congestion: Secondary | ICD-10-CM | POA: Diagnosis not present

## 2024-10-03 DIAGNOSIS — J069 Acute upper respiratory infection, unspecified: Secondary | ICD-10-CM | POA: Diagnosis not present

## 2024-10-17 ENCOUNTER — Ambulatory Visit (HOSPITAL_COMMUNITY)
Admission: RE | Admit: 2024-10-17 | Source: Ambulatory Visit | Attending: Cardiovascular Disease | Admitting: Cardiovascular Disease

## 2024-10-17 DIAGNOSIS — Z79899 Other long term (current) drug therapy: Secondary | ICD-10-CM | POA: Diagnosis not present

## 2024-10-25 NOTE — Progress Notes (Signed)
 Pharmacy Quality Measure Review  This patient is appearing on a report for being at risk of failing the adherence measure for cholesterol (statin) and hypertension (ACEi/ARB) medications this calendar year.   Medication: lisinopril  Last fill date: 10/21/24 for 28 day supply  Medication: atorvastatin  Last fill date: 10/21/24 for 28 day supply  Insurance report was not up to date. No action needed at this time.   Corina Stacy E. Marsh, PharmD, CPP Clinical Pharmacist [facility] [phone]

## 2024-12-05 ENCOUNTER — Ambulatory Visit (HOSPITAL_COMMUNITY)
Admission: RE | Admit: 2024-12-05 | Discharge: 2024-12-05 | Attending: Cardiovascular Disease | Admitting: Cardiovascular Disease

## 2024-12-05 DIAGNOSIS — R0600 Dyspnea, unspecified: Secondary | ICD-10-CM | POA: Diagnosis not present

## 2024-12-05 DIAGNOSIS — R0602 Shortness of breath: Secondary | ICD-10-CM | POA: Insufficient documentation

## 2024-12-05 LAB — ECHOCARDIOGRAM COMPLETE
Area-P 1/2: 4.44 cm2
S' Lateral: 2.9 cm

## 2024-12-05 MED ADMIN — Perflutren Lipid Microsphere IV Susp 1.1 MG/ML: 1 mL | INTRAVENOUS | NDC 99999100031

## 2024-12-06 ENCOUNTER — Ambulatory Visit: Payer: Self-pay | Admitting: Cardiovascular Disease

## 2025-01-14 ENCOUNTER — Encounter (HOSPITAL_COMMUNITY): Payer: Self-pay

## 2025-01-14 ENCOUNTER — Ambulatory Visit (INDEPENDENT_AMBULATORY_CARE_PROVIDER_SITE_OTHER): Admitting: Clinical

## 2025-01-14 DIAGNOSIS — F331 Major depressive disorder, recurrent, moderate: Secondary | ICD-10-CM | POA: Diagnosis not present

## 2025-01-14 DIAGNOSIS — F902 Attention-deficit hyperactivity disorder, combined type: Secondary | ICD-10-CM

## 2025-01-15 NOTE — Progress Notes (Signed)
 Comprehensive Clinical Assessment (CCA) Note  01/15/2025 Ivan [name] [id number]  Chief Complaint:  Chief Complaint  Patient presents with   Anxiety   Depression   Visit Diagnosis:  MDD, recurrent episode, moderate with anxious distress ADHD, combined   Treatment Recommendations: individual therapy. Client reported he is receiving medication management   CCA Biopsychosocial Intake/Chief Complaint:  client reported he is referred by his group home.client reported he is [facility] with [facility] [place]. client reported he needs therapy. client reported his psychiatrist has given him bipolar disorder, adhd, and OCD. client reported the OCD is more so making rash/ impulsive decisions. client reported he has not had a therapist in years.  Current Symptoms/Problems: client reported this year so far he is feeling better. client reported he has started a diet. client reported he gets depressed but he does not let it take over as bad as he use to. client reported in 2021 he hit rock bottom waking up and calling his family about various random things. client reported he woke up out of sleep feeling sorry for what he did to them when he was younger. client reported regarding impulsive decisions sometimes he can jump the gun with what he says and how he reacts. client reported the bipolar diagnosis stems from anger which he says has gotten better.  Patient Reported Schizophrenia/Schizoaffective Diagnosis in Past: No  Strengths: engage in treatment planning  Preferences: therapy and medication management  Abilities: discuss history of symptoms  Type of Services Patient Feels are Needed: therapy   Initial Clinical Notes/Concerns: client reported no history of AVH and S.I. client reported he has a childhood diagnosis for conduct disorder. client reported as a child he had issues with property destruction and fighting others but not as an adult.   Mental Health  Symptoms Depression:  Change in energy/activity   Duration of Depressive symptoms: Greater than two weeks   Mania:  None   Anxiety:   Tension   Psychosis:  None   Duration of Psychotic symptoms: No data recorded  Trauma:  None   Obsessions:  None   Compulsions:  None   Inattention:  None   Hyperactivity/Impulsivity:  None   Oppositional/Defiant Behaviors:  None   Emotional Irregularity:  None   Other Mood/Personality Symptoms:  No data recorded   Mental Status Exam Appearance and self-care  Stature:  Tall   Weight:  Average weight   Clothing:  Casual   Grooming:  Normal   Cosmetic use:  Age appropriate   Posture/gait:  Normal   Motor activity:  Not Remarkable   Sensorium  Attention:  Normal   Concentration:  Normal   Orientation:  X5   Recall/memory:  Normal   Affect and Mood  Affect:  Congruent   Mood:  Euthymic   Relating  Eye contact:  Normal   Facial expression:  Responsive   Attitude toward examiner:  Cooperative   Thought and Language  Speech flow: Clear and Coherent   Thought content:  Appropriate to Mood and Circumstances   Preoccupation:  None   Hallucinations:  None   Organization:  No data recorded  [employer] of Knowledge:  Good   Intelligence:  Average   Abstraction:  Normal   Judgement:  Good   Reality Testing:  Adequate   Insight:  Good   Decision Making:  Normal   Social Functioning  Social Maturity:  Responsible   Social Judgement:  Normal   Stress  Stressors:  Transitions  Coping Ability:  Resilient   Skill Deficits:  Activities of daily living   Supports:  Family; Friends/Service system     Religion: Religion/Spirituality Are You A Religious Person?: No  Leisure/Recreation: Leisure / Recreation Do You Have Hobbies?: No  Exercise/Diet: Exercise/Diet Do You Exercise?: No Have You Gained or Lost A Significant Amount of Weight in the Past Six Months?: No Do You Follow a  Special Diet?: No Do You Have Any Trouble Sleeping?: Yes Explanation of Sleeping Difficulties: client reported he takes medication to help him sleep.   CCA Employment/Education Employment/Work Situation: Employment / Work Systems Developer: Unemployed  Education: Education Is Patient Currently Attending School?: No Did Garment/textile Technologist From [employer]?: Yes   CCA Family/Childhood History Family and Relationship History: Family history Marital status: Single Does patient have children?: No  Childhood History:  Childhood History Additional childhood history information: client reported he lives with his staff. client reported he has lived with his current staff since january ,2025. Does patient have siblings?: Yes Number of Siblings: 4 Description of patient's current relationship with siblings: client reported he has 4 sister. client reported he never met his 2 younger sisters. Did patient suffer any verbal/emotional/physical/sexual abuse as a child?: No Did patient suffer from severe childhood neglect?: No Has patient ever been sexually abused/assaulted/raped as an adolescent or adult?: No Was the patient ever a victim of a crime or a disaster?: No Witnessed domestic violence?: No Has patient been affected by domestic violence as an adult?: No  Child/Adolescent Assessment:     CCA Substance Use Alcohol/Drug Use: Alcohol / Drug Use History of alcohol / drug use?: No history of alcohol / drug abuse                         ASAM's:  Six Dimensions of Multidimensional Assessment  Dimension 1:  Acute Intoxication and/or Withdrawal Potential:      Dimension 2:  Biomedical Conditions and Complications:      Dimension 3:  Emotional, Behavioral, or Cognitive Conditions and Complications:     Dimension 4:  Readiness to Change:     Dimension 5:  Relapse, Continued use, or Continued Problem Potential:     Dimension 6:  Recovery/Living Environment:      ASAM Severity Score:    ASAM Recommended Level of Treatment:     Substance use Disorder (SUD)    Recommendations for Services/Supports/Treatments: Recommendations for Services/Supports/Treatments Recommendations For Services/Supports/Treatments: Individual Therapy  DSM5 Diagnoses: Patient Active Problem List   Diagnosis Date Noted   Primary hypertension 04/24/2024   Mixed hyperlipidemia 04/24/2024   Attention deficit disorder of childhood 04/12/2012   Episodic mood disorder 04/15/2011    Patient Centered Plan: Patient is on the following Treatment Plan(s):  Depression   Referrals to Alternative Service(s): Referred to Alternative Service(s):   Place:   Date:   Time:    Referred to Alternative Service(s):   Place:   Date:   Time:    Referred to Alternative Service(s):   Place:   Date:   Time:    Referred to Alternative Service(s):   Place:   Date:   Time:      Collaboration of Care: Referral or follow-up with counselor/therapist [facility]  Patient/Guardian was advised Release of Information must be obtained prior to any record release in order to collaborate their care with an outside provider. Patient/Guardian was advised if they have not already done so to contact the registration department to  sign all necessary forms in order for us  to release information regarding their care.   Consent: Patient/Guardian gives verbal consent for treatment and assignment of benefits for services provided during this visit. Patient/Guardian expressed understanding and agreed to proceed.   Ala Capri Y Kalix Meinecke, LCSW

## 2025-02-10 ENCOUNTER — Ambulatory Visit (HOSPITAL_COMMUNITY): Admitting: Clinical
